# Patient Record
Sex: Female | Born: 1990 | Race: White | Hispanic: No | Marital: Single | Smoking: Never smoker
Health system: Southern US, Community
[De-identification: ages and names within clinical notes are randomized; demographics above are authoritative.]

## PROBLEM LIST (undated history)

## (undated) DIAGNOSIS — B977 Papillomavirus as the cause of diseases classified elsewhere: Secondary | ICD-10-CM

## (undated) DIAGNOSIS — A048 Other specified bacterial intestinal infections: Secondary | ICD-10-CM

## (undated) DIAGNOSIS — B019 Varicella without complication: Secondary | ICD-10-CM

## (undated) DIAGNOSIS — N390 Urinary tract infection, site not specified: Secondary | ICD-10-CM

## (undated) HISTORY — DX: Varicella without complication: B01.9

## (undated) HISTORY — DX: Papillomavirus as the cause of diseases classified elsewhere: B97.7

## (undated) HISTORY — DX: Urinary tract infection, site not specified: N39.0

## (undated) HISTORY — PX: COLPOSCOPY: SHX161

## (undated) HISTORY — DX: Other specified bacterial intestinal infections: A04.8

---
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
## 2012-09-28 LAB — HM PAP SMEAR: HM Pap smear: NORMAL

## 2013-09-26 ENCOUNTER — Telehealth: Payer: Self-pay

## 2013-09-26 NOTE — Telephone Encounter (Signed)
Medication and allergies:  Reviewed and updated  90 day supply/mail order: n/a Local pharmacy: [facility] [id number] - [place], [place] - [address] [facility] [address]    Immunizations due:  Influenza-declined   A/P: Personal, family and past surgical hx updated.   PAP- 01/2013-abnormal; repeat done in July-very low abnormality; repeat scheduled for Feb. 11, 2015. Flu-declined Tdap- Unsure of when she last received the vaccine.   Patient stated that all of her current records were in [place] at [facility].  She plans to complete release form upon appt so records can be faxed here.   To Discuss with Provider: Not at this time.

## 2013-09-28 ENCOUNTER — Ambulatory Visit (INDEPENDENT_AMBULATORY_CARE_PROVIDER_SITE_OTHER): Payer: 59 | Admitting: Family Medicine

## 2013-09-28 ENCOUNTER — Encounter: Payer: Self-pay | Admitting: Family Medicine

## 2013-09-28 VITALS — BP 120/78 | HR 76 | Temp 98.2°F | Resp 16 | Ht 64.25 in | Wt 128.2 lb

## 2013-09-28 DIAGNOSIS — Z0000 Encounter for general adult medical examination without abnormal findings: Secondary | ICD-10-CM

## 2013-09-28 DIAGNOSIS — Z8271 Family history of polycystic kidney: Secondary | ICD-10-CM

## 2013-09-28 LAB — BASIC METABOLIC PANEL
BUN: 9 mg/dL (ref 6–23)
CHLORIDE: 109 meq/L (ref 96–112)
CO2: 24 meq/L (ref 19–32)
Calcium: 8.8 mg/dL (ref 8.4–10.5)
Creatinine, Ser: 0.7 mg/dL (ref 0.4–1.2)
GFR: 107.97 mL/min (ref >60.00)
Glucose, Bld: 82 mg/dL (ref 70–99)
POTASSIUM: 3.8 meq/L (ref 3.5–5.1)
Sodium: 140 mEq/L (ref 135–145)

## 2013-09-28 LAB — LIPID PANEL
CHOL/HDL RATIO: 3
Cholesterol: 171 mg/dL (ref 0–200)
HDL: 62.3 mg/dL (ref >39.00)
LDL CALC: 94 mg/dL (ref 0–99)
TRIGLYCERIDES: 76 mg/dL (ref 0.0–149.0)
VLDL: 15.2 mg/dL (ref 0.0–40.0)

## 2013-09-28 LAB — HEPATIC FUNCTION PANEL
ALBUMIN: 3.5 g/dL (ref 3.5–5.2)
ALT: 11 U/L (ref 0–35)
AST: 14 U/L (ref 0–37)
Alkaline Phosphatase: 39 U/L (ref 39–117)
Bilirubin, Direct: 0 mg/dL (ref 0.0–0.3)
Total Bilirubin: 0.7 mg/dL (ref 0.3–1.2)
Total Protein: 6.6 g/dL (ref 6.0–8.3)

## 2013-09-28 LAB — CBC WITH DIFFERENTIAL/PLATELET
BASOS ABS: 0.1 10*3/uL (ref 0.0–0.1)
BASOS PCT: 1 % (ref 0.0–3.0)
Eosinophils Absolute: 0.2 10*3/uL (ref 0.0–0.7)
Eosinophils Relative: 2.7 % (ref 0.0–5.0)
HCT: 39.9 % (ref 36.0–46.0)
HEMOGLOBIN: 13.1 g/dL (ref 12.0–15.0)
LYMPHS PCT: 29.8 % (ref 12.0–46.0)
Lymphs Abs: 1.7 10*3/uL (ref 0.7–4.0)
MCHC: 32.8 g/dL (ref 30.0–36.0)
MCV: 90.2 fl (ref 78.0–100.0)
Monocytes Absolute: 0.4 10*3/uL (ref 0.1–1.0)
Monocytes Relative: 7.4 % (ref 3.0–12.0)
NEUTROS PCT: 59.1 % (ref 43.0–77.0)
Neutro Abs: 3.3 10*3/uL (ref 1.4–7.7)
Platelets: 296 10*3/uL (ref 150.0–400.0)
RBC: 4.43 Mil/uL (ref 3.87–5.11)
RDW: 12.9 % (ref 11.5–14.6)
WBC: 5.6 10*3/uL (ref 4.5–10.5)

## 2013-09-28 LAB — TSH: TSH: 1.89 u[IU]/mL (ref 0.35–5.50)

## 2013-09-28 NOTE — Assessment & Plan Note (Signed)
Pt has never been evaluated w/ US.  Will get renal US to assess.

## 2013-09-28 NOTE — Progress Notes (Signed)
   Subjective:    Patient ID: Shelly LangtonHannah Labrosse, female    DOB: 03/07/1991, 23 y.o.   MRN: [id number]  HPI New to establish.  Previous MD- Leticia Clasivera [facility]  No concerns   Review of Systems Patient reports no vision/ hearing changes, adenopathy,fever, weight change,  persistant/recurrent hoarseness , swallowing issues, chest pain, palpitations, edema, persistant/recurrent cough, hemoptysis, dyspnea (rest/exertional/paroxysmal nocturnal), gastrointestinal bleeding (melena, rectal bleeding), abdominal pain, significant heartburn, bowel changes, GU symptoms (dysuria, hematuria, incontinence), Gyn symptoms (abnormal  bleeding, pain),  syncope, focal weakness, memory loss, numbness & tingling, skin/hair/nail changes, abnormal bruising or bleeding, anxiety, or depression.     Objective:   Physical Exam General Appearance:    Alert, cooperative, no distress, appears stated age  Head:    Normocephalic, without obvious abnormality, atraumatic  Eyes:    PERRL, conjunctiva/corneas clear, EOM's intact, fundi    benign, both eyes  Ears:    Normal TM's and external ear canals, both ears  Nose:   Nares normal, septum midline, mucosa normal, no drainage    or sinus tenderness  Throat:   Lips, mucosa, and tongue normal; teeth and gums normal  Neck:   Supple, symmetrical, trachea midline, no adenopathy;    Thyroid: no enlargement/tenderness/nodules  Back:     Symmetric, no curvature, ROM normal, no CVA tenderness  Lungs:     Clear to auscultation bilaterally, respirations unlabored  Chest Wall:    No tenderness or deformity   Heart:    Regular rate and rhythm, S1 and S2 normal, no murmur, rub   or gallop  Breast Exam:    Deferred to GYN  Abdomen:     Soft, non-tender, bowel sounds active all four quadrants,    no masses, no organomegaly  Genitalia:    Deferred to GYN  Rectal:    Extremities:   Extremities normal, atraumatic, no cyanosis or edema  Pulses:   2+ and symmetric all extremities    Skin:   Skin color, texture, turgor normal, no rashes or lesions  Lymph nodes:   Cervical, supraclavicular, and axillary nodes normal  Neurologic:   CNII-XII intact, normal strength, sensation and reflexes    throughout          Assessment & Plan:

## 2013-09-28 NOTE — Progress Notes (Signed)
Pre visit review using our clinic review tool, if applicable. No additional management support is needed unless otherwise documented below in the visit note. 

## 2013-09-28 NOTE — Patient Instructions (Signed)
Follow up in 1 year or as needed Keep up the good work! We'll call you with your US appt Call with any questions or concerns Welcome!  We're glad to have you!

## 2013-09-28 NOTE — Assessment & Plan Note (Signed)
Pt's PE WNL.  UTD on GYN.  Check labs.  Anticipatory guidance provided.  

## 2013-09-29 ENCOUNTER — Encounter: Payer: Self-pay | Admitting: General Practice

## 2013-10-02 LAB — VITAMIN D 1,25 DIHYDROXY
VITAMIN D3 1, 25 (OH): 50 pg/mL
Vitamin D 1, 25 (OH)2 Total: 50 pg/mL (ref 18–72)

## 2013-10-03 ENCOUNTER — Encounter: Payer: Self-pay | Admitting: General Practice

## 2013-10-17 ENCOUNTER — Other Ambulatory Visit: Payer: Self-pay | Admitting: Family Medicine

## 2013-10-17 ENCOUNTER — Ambulatory Visit (HOSPITAL_COMMUNITY)
Admission: RE | Admit: 2013-10-17 | Discharge: 2013-10-17 | Disposition: A | Discharge status: Home / Self Care | Payer: 59 | Source: Ambulatory Visit | Attending: Family Medicine | Admitting: Family Medicine

## 2013-10-17 DIAGNOSIS — N390 Urinary tract infection, site not specified: Secondary | ICD-10-CM | POA: Insufficient documentation

## 2013-10-17 DIAGNOSIS — Z8271 Family history of polycystic kidney: Secondary | ICD-10-CM

## 2013-10-17 DIAGNOSIS — N281 Cyst of kidney, acquired: Secondary | ICD-10-CM

## 2013-12-22 ENCOUNTER — Telehealth: Payer: Self-pay | Admitting: Family Medicine

## 2013-12-22 MED ORDER — CIPROFLOXACIN HCL 500 MG PO TABS: 500.0000 mg | ORAL_TABLET | Freq: Two times a day (BID) | ORAL | Status: DC

## 2013-12-22 NOTE — Telephone Encounter (Signed)
Caller name:Shelly Taylor Relation to NU:UVOZDGUpt:patient Call back number:[phone] Pharmacy:[facility] on [address]  Reason for call: . Patient is traveling to [place] on Saturday and is requesting an antibiotic for an upset stomach just in case she gets sick.

## 2013-12-22 NOTE — Telephone Encounter (Signed)
Med filled and pt notified.  

## 2013-12-22 NOTE — Telephone Encounter (Signed)
Ok for Cipro 500mg  BID x10 days in case of Traveller's Diarrhea

## 2014-03-29 LAB — BASIC METABOLIC PANEL
BUN: 12 mg/dL (ref 4–21)
CREATININE: 0.6 mg/dL (ref <=1.1)
Glucose: 81 mg/dL
Sodium: 138 mmol/L (ref 137–147)

## 2014-04-03 ENCOUNTER — Encounter: Payer: Self-pay | Admitting: General Practice

## 2014-06-19 ENCOUNTER — Ambulatory Visit (INDEPENDENT_AMBULATORY_CARE_PROVIDER_SITE_OTHER): Payer: 59 | Admitting: Family Medicine

## 2014-06-19 ENCOUNTER — Encounter: Payer: Self-pay | Admitting: Family Medicine

## 2014-06-19 VITALS — BP 124/84 | HR 71 | Temp 98.1°F | Resp 16 | Wt 136.5 lb

## 2014-06-19 DIAGNOSIS — B070 Plantar wart: Secondary | ICD-10-CM

## 2014-06-19 NOTE — Patient Instructions (Addendum)
Schedule your complete physical at your convenience (after Sep 28, 2014) Apply Neosporin to the surrounding tissue- some redness and sensitivity is to be expected Apply Compound W to the wart and apply duct tape every 3-4 days Call with any questions or concerns Happy Holidays!!!

## 2014-06-19 NOTE — Progress Notes (Signed)
   Subjective:    Patient ID: Shelly LangtonHannah Taylor, female    DOB: 09/22/1990, 23 y.o.   MRN: [id number]  HPI wart- plantar wart at base of R 5th toe.  Now painful to touch or 'step on it wrong'.  Pt has not used OTC wart medication   Review of Systems For ROS see HPI     Objective:   Physical Exam  Constitutional: She appears well-developed and well-nourished. No distress.  Skin: Skin is warm and dry.  1 cm plantar wart on R foot at base of 5th toe.  Pt tolerated 4 cycles of freeze/thaw w/ liquid nitrogen gun  Vitals reviewed.         Assessment & Plan:

## 2014-06-19 NOTE — Assessment & Plan Note (Signed)
New.  Pt tolerated 4 cycles of freeze/thaw w/ liquid nitrogen.  Reviewed possible need to repeat procedure and to use OTC Compound W at home to finish treatment.  Pt expressed understanding and is in agreement w/ plan.

## 2014-06-19 NOTE — Progress Notes (Signed)
Pre visit review using our clinic review tool, if applicable. No additional management support is needed unless otherwise documented below in the visit note. 

## 2014-07-26 ENCOUNTER — Ambulatory Visit: Payer: Self-pay | Admitting: Podiatry

## 2014-07-31 ENCOUNTER — Ambulatory Visit: Payer: Self-pay | Admitting: Podiatry

## 2014-08-01 IMAGING — US US RENAL
1 series · 14 of 25 positions shown · non-contrast
Comparison: None.

CLINICAL DATA: Family history of polycystic kidney disease.  UTIs.

EXAM:
RENAL/URINARY TRACT ULTRASOUND COMPLETE

[Series 1: us renal · 0.21mm/px · 14 of 67 slices shown]
[im 1/67]
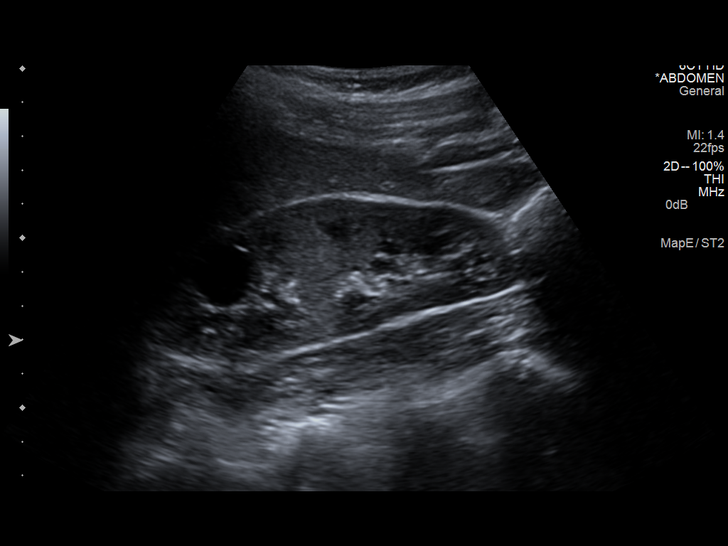
[im 6/67]
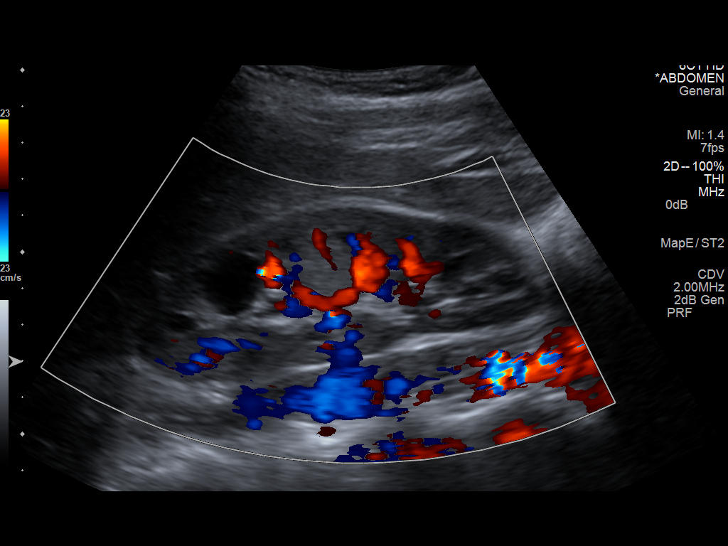
[im 12/67]
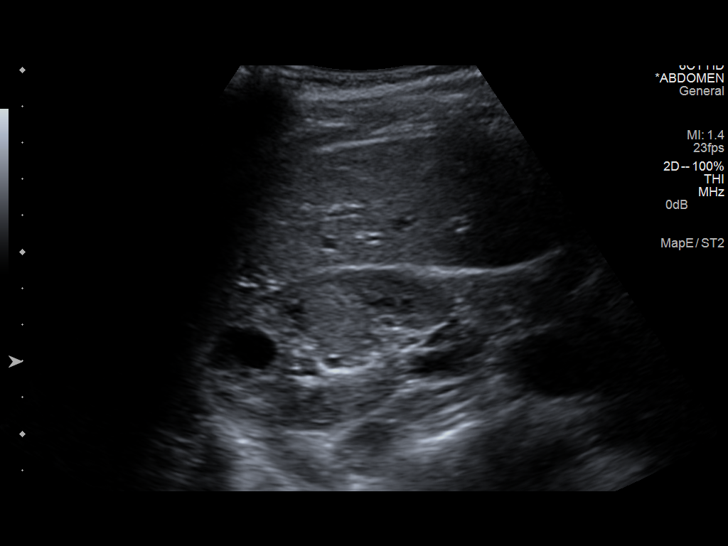
[im 17/67]
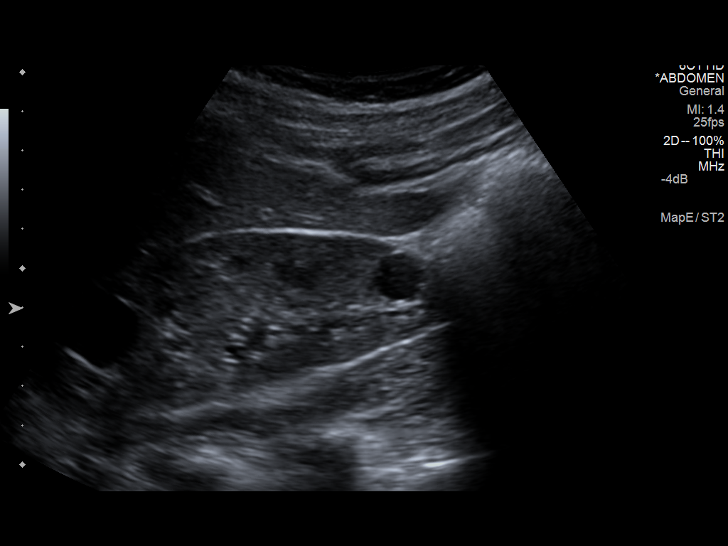
[im 23/67]
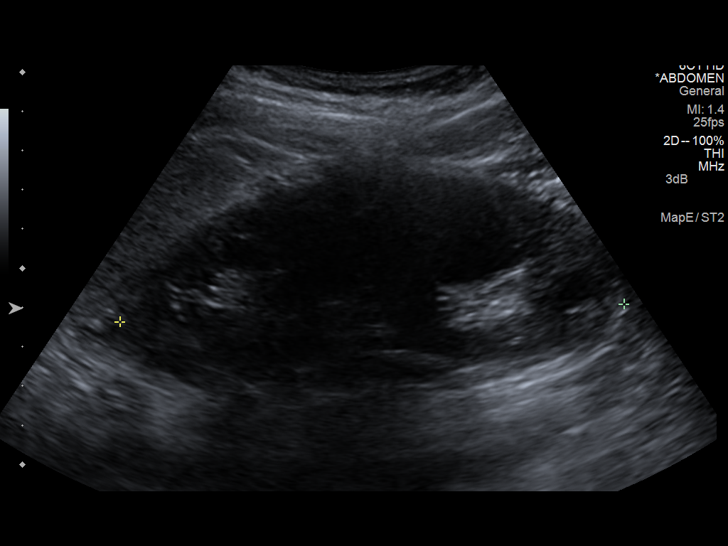
[im 25/67]
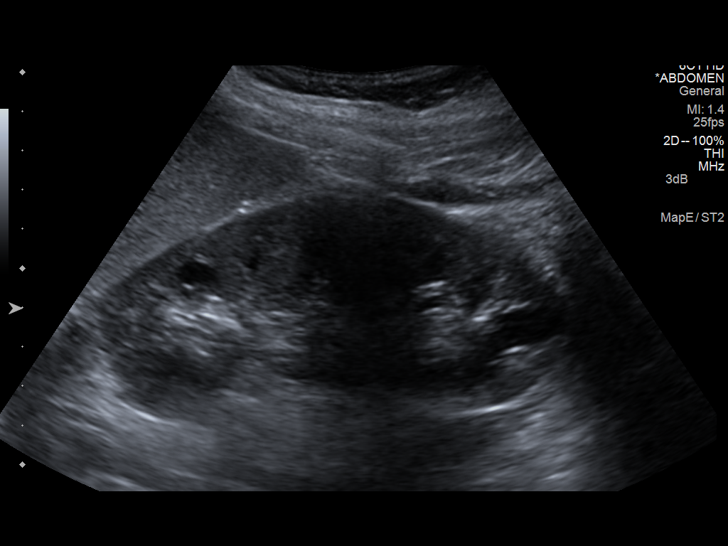
[im 31/67]
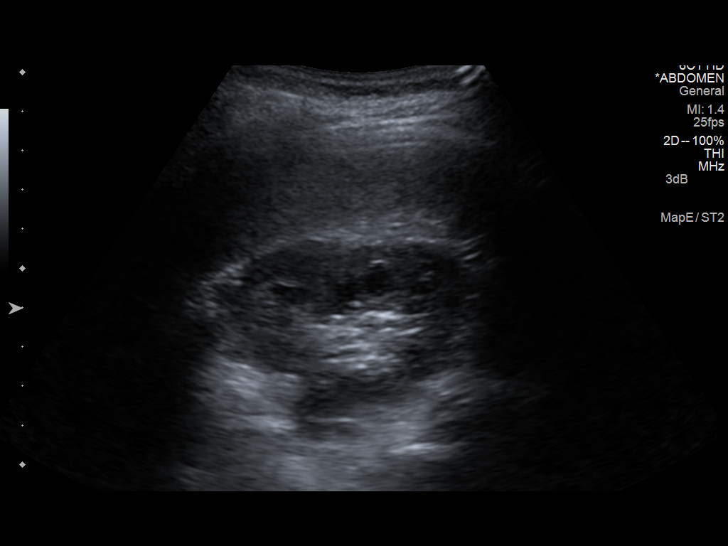
[im 36/67]
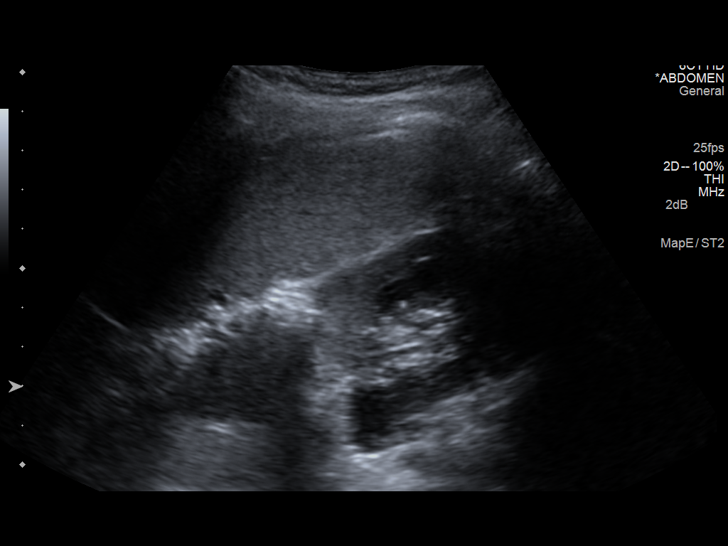
[im 42/67]
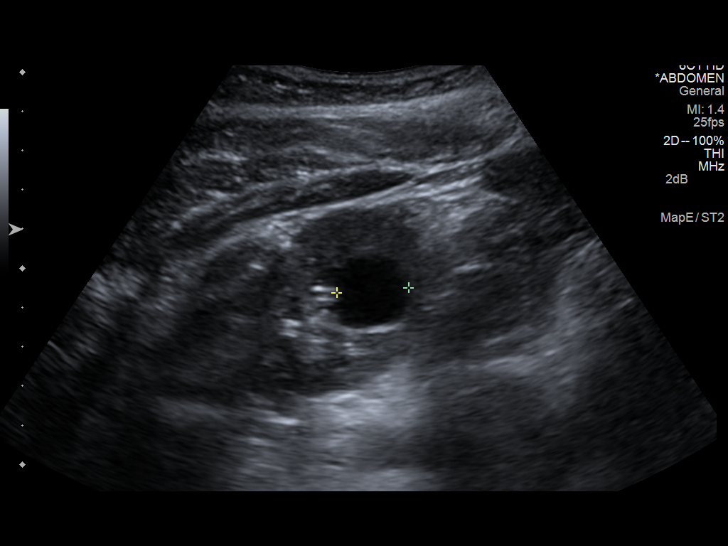
[im 45/67]
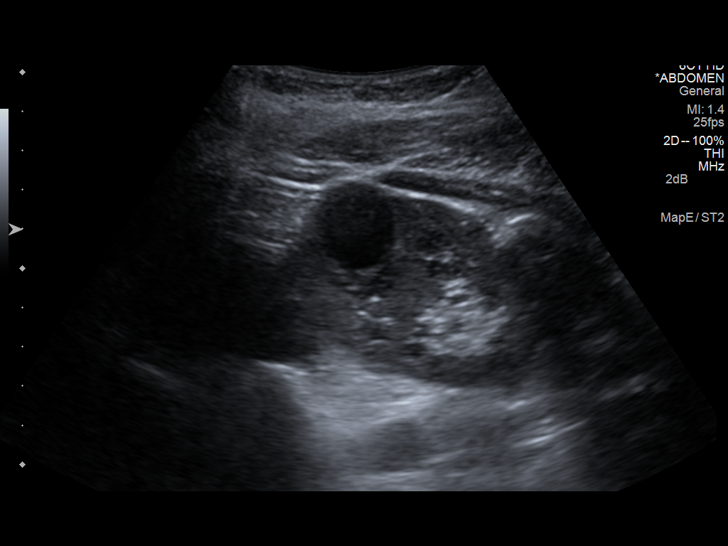
[im 50/67]
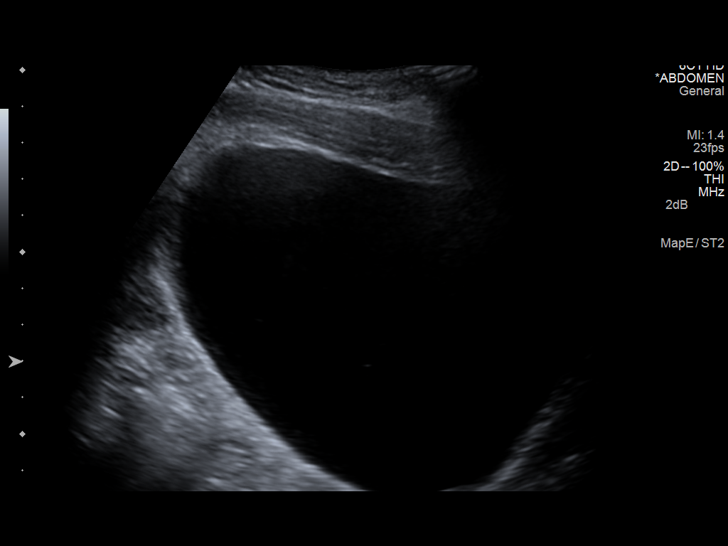
[im 56/67]
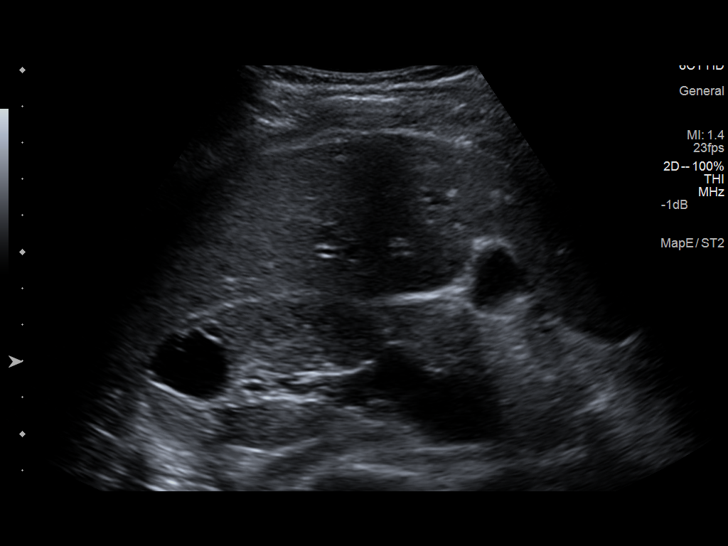
[im 61/67]
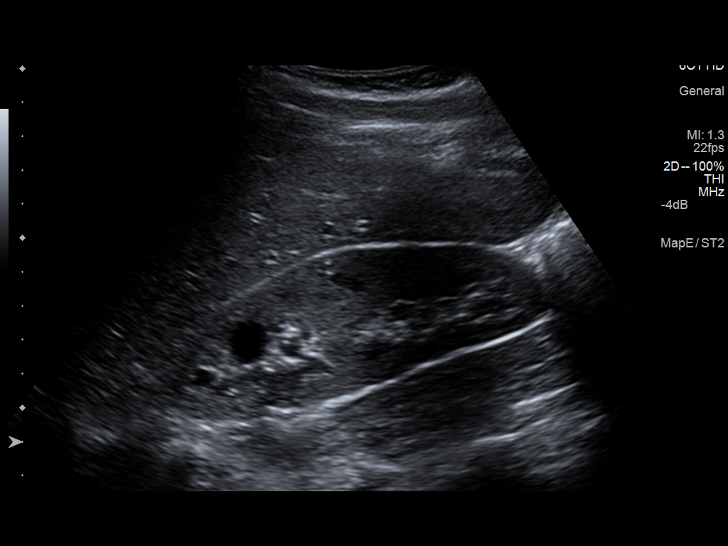
[im 67/67]
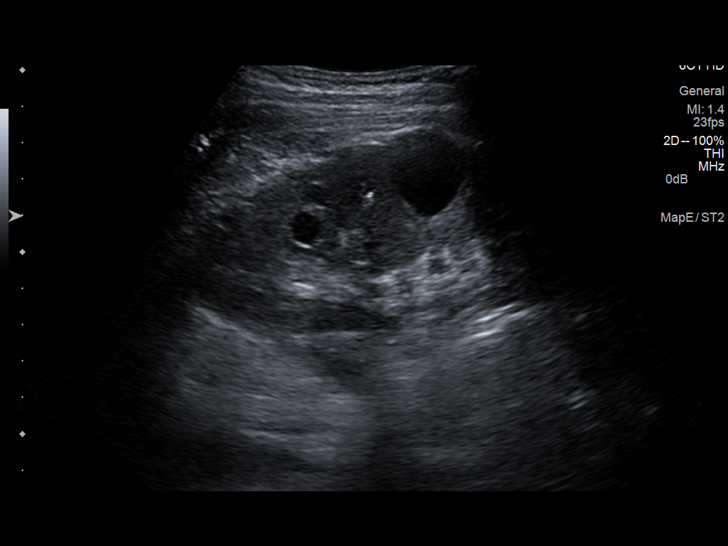

[14 of 25 positions shown; findings below may reference images not displayed]

FINDINGS: Right Kidney:

Length: 11.6 cm. There are 2 anechoic right renal masses. The
anechoic mass in the upper pole measures 1.9 x 1.9 x 2.3 cm. The
anechoic mass in the inferior pole measures 1.3 x 1.2 x 1.3 cm.
Echogenicity within normal limits. No hydronephrosis visualized.

Left Kidney:

Length: 12.8 cm. There are 3 anechoic left renal masses. The upper
pole anechoic renal mass measures 1.8 x 1.9 x 1.1 cm. The anechoic
left interpolar renal mass measures 1.8 x 2.1 x 2.3 cm with an
adjacent 2.1 x 2.1 x 1.8 cm anechoic mass.. Echogenicity within
normal limits. No hydronephrosis visualized.

Bladder:

Appears normal for degree of bladder distention.
IMPRESSION: 1. Multiple bilateral renal cysts.
2. No obstructive uropathy.

## 2014-08-16 ENCOUNTER — Other Ambulatory Visit: Payer: Self-pay | Admitting: Physician Assistant

## 2014-08-16 ENCOUNTER — Ambulatory Visit: Payer: 59 | Admitting: Medical

## 2014-08-16 DIAGNOSIS — R1013 Epigastric pain: Secondary | ICD-10-CM

## 2014-08-16 DIAGNOSIS — R1011 Right upper quadrant pain: Secondary | ICD-10-CM

## 2014-08-16 DIAGNOSIS — R110 Nausea: Secondary | ICD-10-CM

## 2014-08-25 ENCOUNTER — Other Ambulatory Visit: Payer: 59

## 2014-08-28 ENCOUNTER — Other Ambulatory Visit: Payer: 59

## 2014-08-28 ENCOUNTER — Ambulatory Visit: Payer: 59 | Admitting: Family Medicine

## 2014-10-11 ENCOUNTER — Ambulatory Visit (INDEPENDENT_AMBULATORY_CARE_PROVIDER_SITE_OTHER): Payer: 59 | Admitting: Family Medicine

## 2014-10-11 ENCOUNTER — Encounter: Payer: Self-pay | Admitting: Family Medicine

## 2014-10-11 VITALS — BP 106/78 | HR 68 | Temp 97.9°F | Resp 16 | Ht 63.5 in | Wt 137.4 lb

## 2014-10-11 DIAGNOSIS — H538 Other visual disturbances: Secondary | ICD-10-CM

## 2014-10-11 DIAGNOSIS — R5383 Other fatigue: Secondary | ICD-10-CM

## 2014-10-11 DIAGNOSIS — Z0000 Encounter for general adult medical examination without abnormal findings: Secondary | ICD-10-CM

## 2014-10-11 NOTE — Progress Notes (Signed)
Pre visit review using our clinic review tool, if applicable. No additional management support is needed unless otherwise documented below in the visit note. 

## 2014-10-11 NOTE — Patient Instructions (Signed)
Follow up as need if fatigue doesn't improve We'll notify you of your lab results and make any changes if needed We'll call you with your eye exam appt Continue to make healthy food choices and get regular exercise- you look great! Call with any questions or concerns Happy Spring!

## 2014-10-11 NOTE — Progress Notes (Signed)
   Subjective:    Patient ID: Vinnie LangtonHannah Pajak, female    DOB: 11/13/1990, 24 y.o.   MRN: [id number]  HPI CPE- UTD on GYN Su [facility]).  Blurry vision- pt reports since HS she develops blurring around the edges of her vision, like tunnel vision.  Recently has had double vision on 2-3 occasions- this resolved w/ closing 1 eye.  Pt doesn't know if she ever had an eye exam.  Fatigue- pt reports she attempts to get at least 8 hrs of sleep nightly but still finding herself excessively tired.  Particularly difficult to drive more than 30 minutes b/c she will start to fall asleep.  Time of day doesn't matter.  Will wake feeling rested.     Review of Systems Patient reports no hearing changes, adenopathy,fever, weight change,  persistant/recurrent hoarseness , swallowing issues, chest pain, palpitations, edema, persistant/recurrent cough, hemoptysis, dyspnea (rest/exertional/paroxysmal nocturnal), gastrointestinal bleeding (melena, rectal bleeding), significant heartburn, bowel changes, GU symptoms (dysuria, hematuria, incontinence), Gyn symptoms (abnormal  bleeding, pain),  syncope, focal weakness, memory loss, numbness & tingling, skin/hair/nail changes, abnormal bruising or bleeding, anxiety, or depression.   + H pylori infxn ([facility])    Objective:   Physical Exam General Appearance:    Alert, cooperative, no distress, appears stated age  Head:    Normocephalic, without obvious abnormality, atraumatic  Eyes:    PERRL, conjunctiva/corneas clear, EOM's intact, fundi    benign, both eyes  Ears:    Normal TM's and external ear canals, both ears  Nose:   Nares normal, septum midline, mucosa normal, no drainage    or sinus tenderness  Throat:   Lips, mucosa, and tongue normal; teeth and gums normal  Neck:   Supple, symmetrical, trachea midline, no adenopathy;    Thyroid: no enlargement/tenderness/nodules  Back:     Symmetric, no curvature, ROM normal, no CVA tenderness  Lungs:     Clear to  auscultation bilaterally, respirations unlabored  Chest Wall:    No tenderness or deformity   Heart:    Regular rate and rhythm, S1 and S2 normal, no murmur, rub   or gallop  Breast Exam:    Deferred to GYN  Abdomen:     Soft, non-tender, bowel sounds active all four quadrants,    no masses, no organomegaly  Genitalia:    Deferred to GYN  Rectal:    Extremities:   Extremities normal, atraumatic, no cyanosis or edema  Pulses:   2+ and symmetric all extremities  Skin:   Skin color, texture, turgor normal, no rashes or lesions  Lymph nodes:   Cervical, supraclavicular, and axillary nodes normal  Neurologic:   CNII-XII intact, normal strength, sensation and reflexes    throughout          Assessment & Plan:

## 2014-10-12 LAB — CBC WITH DIFFERENTIAL/PLATELET
BASOS ABS: 0 10*3/uL (ref 0.0–0.1)
Basophils Relative: 0.2 % (ref 0.0–3.0)
EOS PCT: 8.1 % — AB (ref 0.0–5.0)
Eosinophils Absolute: 0.8 10*3/uL — ABNORMAL HIGH (ref 0.0–0.7)
HEMATOCRIT: 39.2 % (ref 36.0–46.0)
Hemoglobin: 13.4 g/dL (ref 12.0–15.0)
LYMPHS ABS: 1.9 10*3/uL (ref 0.7–4.0)
Lymphocytes Relative: 20.8 % (ref 12.0–46.0)
MCHC: 34.3 g/dL (ref 30.0–36.0)
MCV: 85.6 fl (ref 78.0–100.0)
Monocytes Absolute: 0.4 10*3/uL (ref 0.1–1.0)
Monocytes Relative: 4 % (ref 3.0–12.0)
NEUTROS ABS: 6.2 10*3/uL (ref 1.4–7.7)
Neutrophils Relative %: 66.9 % (ref 43.0–77.0)
Platelets: 329 10*3/uL (ref 150.0–400.0)
RBC: 4.58 Mil/uL (ref 3.87–5.11)
RDW: 12.7 % (ref 11.5–15.5)
WBC: 9.3 10*3/uL (ref 4.0–10.5)

## 2014-10-12 LAB — BASIC METABOLIC PANEL
BUN: 11 mg/dL (ref 6–23)
CO2: 25 mEq/L (ref 19–32)
CREATININE: 0.71 mg/dL (ref 0.40–1.20)
Calcium: 9.7 mg/dL (ref 8.4–10.5)
Chloride: 104 mEq/L (ref 96–112)
GFR: 108.69 mL/min (ref >60.00)
Glucose, Bld: 74 mg/dL (ref 70–99)
Potassium: 4.3 mEq/L (ref 3.5–5.1)
SODIUM: 136 meq/L (ref 135–145)

## 2014-10-12 LAB — HEPATIC FUNCTION PANEL
ALT: 9 U/L (ref 0–35)
AST: 14 U/L (ref 0–37)
Albumin: 4.3 g/dL (ref 3.5–5.2)
Alkaline Phosphatase: 56 U/L (ref 39–117)
BILIRUBIN TOTAL: 0.3 mg/dL (ref 0.2–1.2)
Bilirubin, Direct: 0 mg/dL (ref 0.0–0.3)
TOTAL PROTEIN: 7.2 g/dL (ref 6.0–8.3)

## 2014-10-12 LAB — LIPID PANEL
Cholesterol: 185 mg/dL (ref 0–200)
HDL: 61.6 mg/dL (ref >39.00)
LDL Cholesterol: 94 mg/dL (ref 0–99)
NonHDL: 123.4
Total CHOL/HDL Ratio: 3
Triglycerides: 147 mg/dL (ref 0.0–149.0)
VLDL: 29.4 mg/dL (ref 0.0–40.0)

## 2014-10-12 LAB — TSH: TSH: 1.53 u[IU]/mL (ref 0.35–4.50)

## 2014-10-12 LAB — VITAMIN D 25 HYDROXY (VIT D DEFICIENCY, FRACTURES): VITD: 39.13 ng/mL (ref 30.00–100.00)

## 2014-10-15 NOTE — Assessment & Plan Note (Signed)
New.  Check labs to r/o metabolic cause.  Discussed that the repetitive motion of driving is sedating for many people and discussed ways to increase her alertness.  Will follow.

## 2014-10-15 NOTE — Assessment & Plan Note (Signed)
Pt's PE WNL.  UTD on GYN.  Check labs.  Anticipatory guidance provided.  

## 2014-10-15 NOTE — Assessment & Plan Note (Signed)
New.  Refer to ophtho for complete eye exam.  Pt expressed understanding and is in agreement w/ plan.

## 2015-04-18 ENCOUNTER — Ambulatory Visit (INDEPENDENT_AMBULATORY_CARE_PROVIDER_SITE_OTHER): Payer: Self-pay | Admitting: Family Medicine

## 2015-04-18 VITALS — BP 130/82 | HR 108 | Temp 98.3°F | Resp 16 | Ht 63.0 in | Wt 141.4 lb

## 2015-04-18 DIAGNOSIS — L739 Follicular disorder, unspecified: Secondary | ICD-10-CM

## 2015-04-18 DIAGNOSIS — Z8742 Personal history of other diseases of the female genital tract: Secondary | ICD-10-CM

## 2015-04-18 DIAGNOSIS — N898 Other specified noninflammatory disorders of vagina: Secondary | ICD-10-CM

## 2015-04-18 LAB — POCT WET PREP WITH KOH
CLUE CELLS WET PREP PER HPF POC: NEGATIVE
KOH Prep POC: NEGATIVE
RBC Wet Prep HPF POC: NEGATIVE
Trichomonas, UA: NEGATIVE
Yeast Wet Prep HPF POC: NEGATIVE

## 2015-04-18 MED ORDER — MUPIROCIN 2 % EX OINT: 1.0000 "application " | TOPICAL_OINTMENT | Freq: Four times a day (QID) | CUTANEOUS | Status: DC

## 2015-04-18 NOTE — Patient Instructions (Signed)
Folliculitis  Folliculitis is inflammation of the hair follicle gland in the skin, out of which hair grows. It is often caused by a bacterial infection. Folliculitis may occur on any skin surface of the body, but the bacteria that cause folliculitis survive best in warm, moist environments. For this reason, folliculitis is most common on the back.  SYMPTOMS   · Red bumps on skin (papules).  · White bumps on skin (pustules).  · Itchy rash.  · Painful rash.  · Fever.  · Swollen lymph glands.  CAUSES   · Poor hygiene.  · Excessive or prolonged sweating.  · Irritation from clothing or athletic gear.  · Tendency toward a type of bacteria (Staphylococcus) infections.  RISK INCREASES WITH:  · Not using antibacterial soap.  · Remaining in sweaty or damp clothing.  · Poor immunity.  PREVENTION   · Remove sweaty clothing as soon as possible after activity.  · Bathe or shower as soon as possible after a workout.  · Use antibacterial soap.  · Pay special attention to your back when cleansing.  TREATMENT   Bacterial folliculitis responds well to oral antibiotics and antibiotics applied to the skin. Treatment first involves cleansing the affected area with antibacterial soap. Warm compression coverings to the area can help large follicles to drain. After washing, applying dressings that contain drying agents will help diminish bacteria in follicles, and help the rash go away more quickly. If the condition does not seem to go away with changes in hygiene alone, antibiotic creams and ointments can be applied. For more serious cases, when the infection extends into the soft tissues (cellulitis), oral antibiotics can be given. Finally, on rare occasions, incision and drainage of the follicles may be needed for the infection to be cured.   Document Released: 08/04/2005 Document Revised: 10/27/2011 Document Reviewed: 11/16/2008  ExitCare® Patient Information ©2015 [employer]. This information is not intended to replace advice given  to you by your health care provider. Make sure you discuss any questions you have with your health care provider.

## 2015-04-18 NOTE — Progress Notes (Signed)
Subjective:    Patient ID: Shelly Taylor, female    DOB: 1991-07-17, 24 y.o.   MRN: [id number] Chief Complaint  Patient presents with  . Vaginal Discharge  . std check    HPI   Has a small pimple on her left buttock cheek near perineum - very that was a little itchy and then scratched it and has been a little painful or red before.  Not enlarging or actively draining.  Has had similar things prior like hair follicle infection that always resolved without difficulty but this is much more proximal to her vaginal area and so much more bothersome as well as difficult getting a view of so she is not sure what it is.  Has a cloudy white discharge for several days.  No vaginal products/creams/discharge. Slight odor but no vaginal itching.  Figures as long as she is here she might as well have it checked - normally probably wouldn't bother her.  Has a h/o UTIs but no urine sxs currently.  Ocella religiously and condoms usually.  She is in a monogamous relationship and has only had 2 sexual partner in lifetime.  Does have a h/o mildly abnormal pap - sounds like it is ASCUS with +HPV in [place] 01/2013 initially, since has been followed at [facility] [place] [facility] with repeat every 6 mos and sev colposcopies w/ biopsies - have now been normal and is going to be able to switch back in annual paps. Would like to transfer her well woman care to a provider who is in the [facility] system so will discuss w/ her PCP.  She has not received HPV vaccination series which her gyn did recommend her to do but pt wants second opinion from new gyn prior.  PCP: [facility]  Past Medical History  Diagnosis Date  . Chicken pox   . UTI (lower urinary tract infection)     3-4x in 2014  . HPV in female   . H. pylori infection    Current Outpatient Prescriptions on File Prior to Visit  Medication Sig Dispense Refill  . drospirenone-ethinyl estradiol (YASMIN,[name],SYEDA) 3-0.03 MG tablet Take 1 tablet by mouth  daily.     No current facility-administered medications on file prior to visit.   No Known Allergies   Review of Systems  Constitutional: Negative for fever, chills, diaphoresis, activity change, appetite change, fatigue and unexpected weight change.  Gastrointestinal: Negative for abdominal pain, diarrhea, constipation, blood in stool, anal bleeding and rectal pain.  Genitourinary: Positive for vaginal discharge and genital sores. Negative for dysuria, urgency, frequency, hematuria, decreased urine volume, vaginal bleeding, difficulty urinating, vaginal pain, menstrual problem, pelvic pain and dyspareunia.  Musculoskeletal: Negative for gait problem.  Skin: Positive for color change and wound.  Hematological: Negative for adenopathy.  Psychiatric/Behavioral: Negative for sleep disturbance and dysphoric mood. The patient is not nervous/anxious.        Objective:  BP 130/82 mmHg  Pulse 108  Temp(Src) 98.3 F (36.8 C) (Oral)  Resp 16  Ht  (1.6 m)  Wt 141 lb 6.4 oz (64.139 kg)  BMI 25.05 kg/m2  SpO2 99%  LMP 04/03/2015  Physical Exam  Constitutional: She is oriented to person, place, and time. She appears well-developed and well-nourished. No distress.  HENT:  Head: Normocephalic and atraumatic.  Right Ear: External ear normal.  Eyes: Conjunctivae are normal. No scleral icterus.  Pulmonary/Chest: Effort normal.  Genitourinary: Vagina normal and uterus normal.    There is no rash, tenderness, lesion  or injury on the right labia. There is no rash, tenderness, lesion or injury on the left labia. Cervix exhibits no motion tenderness, no discharge and no friability. Right adnexum displays no mass, no tenderness and no fullness. Left adnexum displays no mass, no tenderness and no fullness.  Lymphadenopathy:       Right: No inguinal adenopathy present.       Left: No inguinal adenopathy present.  Neurological: She is alert and oriented to person, place, and time.  Skin: Skin is  warm and dry. Lesion noted. She is not diaphoretic.  1 cm superficial erythematous nodule with small amount of induration but no fluctuance. Central pinpoint pustule unroofed with tb needle and pressure applied without drainage and minimal pain.   Psychiatric: She has a normal mood and affect. Her behavior is normal.      Results for orders placed or performed in visit on 04/18/15  POCT Wet Prep with KOH  Result Value Ref Range   Trichomonas, UA Negative    Clue Cells Wet Prep HPF POC neg    Epithelial Wet Prep HPF POC Many Few, Moderate, Many   Yeast Wet Prep HPF POC neg    Bacteria Wet Prep HPF POC Few None, Few   RBC Wet Prep HPF POC neg    WBC Wet Prep HPF POC 10-12    KOH Prep POC Negative        Assessment & Plan:   1. Vaginal discharge - UA and exam nml, pt reassured likely physiologic  2. Folliculitis - carbuncle type lesion - no concern for abscess/cellulitis or Bartholin's.  Very superficial so rec warm wet compresses and sits baths q2-4hrs followed by top bactroban until resolved, RTC if sxs worsen  3. History of abnormal cervical Pap smear - pt will discuss w/ PCP, rec pt obtain HPV series.    Orders Placed This Encounter  Procedures  . GC/Chlamydia Probe Amp  . POCT Skin KOH  . POCT Wet Prep with KOH    Meds ordered this encounter  Medications  . mupirocin ointment (BACTROBAN) 2 %    Sig: Apply 1 application topically 4 (four) times daily.    Dispense:  30 g    Refill:  1     Norberto Sorenson, MD MPH

## 2015-04-20 ENCOUNTER — Ambulatory Visit: Payer: Self-pay | Admitting: Family Medicine

## 2015-04-20 LAB — GC/CHLAMYDIA PROBE AMP
CT Probe RNA: NEGATIVE
GC Probe RNA: NEGATIVE

## 2015-04-24 ENCOUNTER — Telehealth: Payer: Self-pay | Admitting: General Practice

## 2015-04-24 NOTE — Telephone Encounter (Signed)
-----   Message from Sheliah Hatch, MD sent at 04/19/2015  8:00 AM EDT ----- Please see if pt wants referral to [facility] (they are the only group I know of in [facility]).  We can certainly start her HPV series here if she is interested or she can wait until she sees GYN.  Thanks! KT ----- Message -----    From: Sherren Mocha, MD    Sent: 04/18/2015   9:12 PM      To: Sheliah Hatch, MD  I had the pleasure of seeing your delightful pt for a mild carbuncle in an inconvenient spot.  However, she discussed that she has been followed by [facility] [place] [facility] for abnormal pap smears and colposcopies and that she would like to transfer her gyn care elsewhere but wanted to go within the [facility] system and definitely wanted your opinion on where to go.  I recommended [facility] w/ [facility] since she has no plans for preg in the near future but i'm sure she would appreciate a call from your office.  She wants to discuss receiving the HPV series w/ her new gyn. Carley Hammed

## 2015-04-24 NOTE — Telephone Encounter (Signed)
Called pt in regards to the Staff message received. LMOVM to return call to verify if pt would like a referral to [facility] and if she would prefer the HPV vaccinations there or to begin at our office.

## 2015-04-27 NOTE — Telephone Encounter (Signed)
Called pt and lmovm again. Closing encountner until pt returns call.

## 2015-05-03 ENCOUNTER — Telehealth: Payer: Self-pay | Admitting: Family Medicine

## 2015-05-03 NOTE — Telephone Encounter (Signed)
Pt was no show 04/20/15 9:15am, acute for std check, pt has not rescheduled, charge for no show?

## 2015-05-03 NOTE — Telephone Encounter (Signed)
yes

## 2015-07-19 ENCOUNTER — Telehealth: Payer: Self-pay | Admitting: Family Medicine

## 2015-07-19 MED ORDER — DROSPIRENONE-ETHINYL ESTRADIOL 3-0.03 MG PO TABS: 1.0000 | ORAL_TABLET | Freq: Every day | ORAL | Status: DC

## 2015-07-19 NOTE — Telephone Encounter (Signed)
Caller name: Self   Can be reached: [phone]  Pharmacy:  [id number] - [place], [place] - [address] AT [facility] [address] [phone] (Phone) [phone] (Fax)         Reason for call: Request refill on drospirenone-ethinyl estradiol (YASMIN,[name],SYEDA) 3-0.03 MG tablet [[id number]][[id number]]

## 2015-07-19 NOTE — Telephone Encounter (Signed)
Medication filled to pharmacy as requested.   

## 2015-09-10 ENCOUNTER — Emergency Department (HOSPITAL_BASED_OUTPATIENT_CLINIC_OR_DEPARTMENT_OTHER)
Admission: EM | Admit: 2015-09-10 | Discharge: 2015-09-10 | Disposition: A | Discharge status: Home / Self Care | Payer: Commercial Managed Care - HMO | Attending: Emergency Medicine | Admitting: Emergency Medicine

## 2015-09-10 ENCOUNTER — Encounter (HOSPITAL_BASED_OUTPATIENT_CLINIC_OR_DEPARTMENT_OTHER): Payer: Self-pay | Admitting: *Deleted

## 2015-09-10 DIAGNOSIS — Z792 Long term (current) use of antibiotics: Secondary | ICD-10-CM | POA: Insufficient documentation

## 2015-09-10 DIAGNOSIS — Z3202 Encounter for pregnancy test, result negative: Secondary | ICD-10-CM | POA: Insufficient documentation

## 2015-09-10 DIAGNOSIS — Z8619 Personal history of other infectious and parasitic diseases: Secondary | ICD-10-CM | POA: Diagnosis not present

## 2015-09-10 DIAGNOSIS — N390 Urinary tract infection, site not specified: Secondary | ICD-10-CM | POA: Insufficient documentation

## 2015-09-10 DIAGNOSIS — R300 Dysuria: Secondary | ICD-10-CM | POA: Diagnosis present

## 2015-09-10 DIAGNOSIS — Z79899 Other long term (current) drug therapy: Secondary | ICD-10-CM | POA: Insufficient documentation

## 2015-09-10 LAB — URINALYSIS, ROUTINE W REFLEX MICROSCOPIC
BILIRUBIN URINE: NEGATIVE
GLUCOSE, UA: NEGATIVE mg/dL
KETONES UR: NEGATIVE mg/dL
NITRITE: NEGATIVE
PH: 6 (ref 5.0–8.0)
Protein, ur: 30 mg/dL — AB
SPECIFIC GRAVITY, URINE: 1.019 (ref 1.005–1.030)

## 2015-09-10 LAB — PREGNANCY, URINE: Preg Test, Ur: NEGATIVE

## 2015-09-10 LAB — URINE MICROSCOPIC-ADD ON

## 2015-09-10 MED ORDER — PHENAZOPYRIDINE HCL 100 MG PO TABS
95.0000 mg | ORAL_TABLET | Freq: Once | ORAL | Status: AC
  Administered 2015-09-10: 100 mg via ORAL
  Filled 2015-09-10: qty 1

## 2015-09-10 MED ORDER — PHENAZOPYRIDINE HCL 200 MG PO TABS: 200.0000 mg | ORAL_TABLET | Freq: Three times a day (TID) | ORAL | Status: DC

## 2015-09-10 MED ORDER — CEFPODOXIME PROXETIL 100 MG PO TABS: 100.0000 mg | ORAL_TABLET | Freq: Two times a day (BID) | ORAL | Status: DC

## 2015-09-10 MED ORDER — FOSFOMYCIN TROMETHAMINE 3 G PO PACK
3.0000 g | PACK | Freq: Once | ORAL | Status: AC
  Administered 2015-09-10: 3 g via ORAL
  Filled 2015-09-10: qty 3

## 2015-09-10 NOTE — ED Provider Notes (Signed)
CSN: [id number]     Arrival date & time 09/10/15  4098 History   First MD Initiated Contact with Patient 09/10/15 [phone]     Chief Complaint  Patient presents with  . Dysuria     (Consider location/radiation/quality/duration/timing/severity/associated sxs/prior Treatment) Patient is a 25 y.o. female presenting with dysuria. The history is provided by the patient.  Dysuria Pain quality:  Sharp and burning Pain severity:  Moderate Onset quality:  Sudden Duration:  2 hours Timing:  Constant Progression:  Unchanged Chronicity:  Recurrent Recent urinary tract infections: no   Relieved by:  Nothing Worsened by:  Nothing tried Ineffective treatments:  None tried Urinary symptoms: frequent urination and hesitancy   Associated symptoms: no abdominal pain, no fever, no flank pain, no nausea, no vaginal discharge and no vomiting   Risk factors: recurrent urinary tract infections   Risk factors: no hx of pyelonephritis and not pregnant     25 yo F with a chief complaint of dysuria. The started just couple hours ago. Patient denies fevers or flank pain. Has a history of recurrent urinary tract infections in the past. Patient denies any vaginal discharge denies abdominal pain.  Past Medical History  Diagnosis Date  . Chicken pox   . UTI (lower urinary tract infection)     3-4x in 2014  . HPV in female   . H. pylori infection    Past Surgical History  Procedure Laterality Date  . Wisdom tooth extraction  Dec 2011    all four   Family History  Problem Relation Age of Onset  . Ovarian cancer Maternal Grandmother   . Uterine cancer Maternal Grandmother   . Polycystic kidney disease Mother     and maternal grandmother  . Colon cancer      maternal great grandfather  . Hyperlipidemia Maternal Grandfather   . Heart disease Maternal Grandfather   . Stroke Maternal Grandfather   . Diabetes Maternal Grandfather    Social History  Substance Use Topics  . Smoking status: Never Smoker    . Smokeless tobacco: Never Used  . Alcohol Use: No     Comment: social   OB History    No data available     Review of Systems  Constitutional: Negative for fever and chills.  HENT: Negative for congestion and rhinorrhea.   Eyes: Negative for redness and visual disturbance.  Respiratory: Negative for shortness of breath and wheezing.   Cardiovascular: Negative for chest pain and palpitations.  Gastrointestinal: Negative for nausea, vomiting and abdominal pain.  Genitourinary: Positive for dysuria. Negative for urgency, flank pain and vaginal discharge.  Musculoskeletal: Negative for myalgias and arthralgias.  Skin: Negative for pallor and wound.  Neurological: Negative for dizziness and headaches.      Allergies  Review of patient's allergies indicates no known allergies.  Home Medications   Prior to Admission medications   Medication Sig Start Date End Date Taking? Authorizing Provider  cefpodoxime (VANTIN) 100 MG tablet Take 1 tablet (100 mg total) by mouth 2 (two) times daily. 09/10/15   Melene Plan, DO  drospirenone-ethinyl estradiol (YASMIN,[name],SYEDA) 3-0.03 MG tablet Take 1 tablet by mouth daily. 07/19/15   Sheliah Hatch, MD  mupirocin ointment (BACTROBAN) 2 % Apply 1 application topically 4 (four) times daily. 04/18/15   Sherren Mocha, MD  phenazopyridine (PYRIDIUM) 200 MG tablet Take 1 tablet (200 mg total) by mouth 3 (three) times daily. 09/10/15   Melene Plan, DO   BP 118/81 mmHg  Pulse 77  Temp(Src) 98.6 F (37 C) (Oral)  Resp 18  Ht  (1.575 m)  Wt 135 lb (61.236 kg)  BMI 24.69 kg/m2  SpO2 99%  LMP 08/26/2015 (Exact Date) Physical Exam  Constitutional: She is oriented to person, place, and time. She appears well-developed and well-nourished. No distress.  HENT:  Head: Normocephalic and atraumatic.  Eyes: EOM are normal. Pupils are equal, round, and reactive to light.  Neck: Normal range of motion. Neck supple.  Cardiovascular: Normal rate and regular  rhythm.  Exam reveals no gallop and no friction rub.   No murmur heard. Pulmonary/Chest: Effort normal. She has no wheezes. She has no rales.  Abdominal: Soft. She exhibits no distension. There is no tenderness.  Musculoskeletal: She exhibits no edema or tenderness.  Neurological: She is alert and oriented to person, place, and time.  Skin: Skin is warm and dry. She is not diaphoretic.  Psychiatric: She has a normal mood and affect. Her behavior is normal.  Nursing note and vitals reviewed.   ED Course  Procedures (including critical care time) Labs Review Labs Reviewed  URINALYSIS, ROUTINE W REFLEX MICROSCOPIC (NOT AT [facility]) - Abnormal; Notable for the following:    APPearance TURBID (*)    Hgb urine dipstick LARGE (*)    Protein, ur 30 (*)    Leukocytes, UA LARGE (*)    All other components within normal limits  URINE MICROSCOPIC-ADD ON - Abnormal; Notable for the following:    Squamous Epithelial / LPF 0-5 (*)    Bacteria, UA MANY (*)    All other components within normal limits  PREGNANCY, URINE    Imaging Review No results found. I have personally reviewed and evaluated these images and lab results as part of my medical decision-making.   EKG Interpretation None      MDM   Final diagnoses:  UTI (lower urinary tract infection)    25 yo F with a chief complaint of dysuria. Patient is well-appearing nontoxic. Benign abdominal exam. Afebrile.  UTI on ua, given fosfomycin.  D/c home.  With recurrent uti's given script for vantin, will start if symptoms are not improved post 48 hours.   7:26 AM:  I have discussed the diagnosis/risks/treatment options with the patient and believe the pt to be eligible for discharge home to follow-up with PCP. We also discussed returning to the ED immediately if new or worsening sx occur. We discussed the sx which are most concerning (e.g., sudden worsening pain, fever, inability to tolerate by mouth) that necessitate immediate return.  Medications administered to the patient during their visit and any new prescriptions provided to the patient are listed below.  Medications given during this visit Medications  phenazopyridine (PYRIDIUM) tablet 100 mg (not administered)  fosfomycin (MONUROL) packet 3 g (not administered)    New Prescriptions   CEFPODOXIME (VANTIN) 100 MG TABLET    Take 1 tablet (100 mg total) by mouth 2 (two) times daily.   PHENAZOPYRIDINE (PYRIDIUM) 200 MG TABLET    Take 1 tablet (200 mg total) by mouth 3 (three) times daily.    The patient appears reasonably screen and/or stabilized for discharge and I doubt any other medical condition or other [facility] requiring further screening, evaluation, or treatment in the ED at this time prior to discharge.    Melene Plan, DO 09/10/15 [phone]

## 2015-09-10 NOTE — ED Notes (Signed)
Sx onset yesterday, worse this am. No meds PTA.

## 2015-09-10 NOTE — Discharge Instructions (Signed)
Fill the script only if you need it for uti if symptoms not better in 24 hours post abx here.  Follow up with your family doc.  Return for fever, pain in your side, vomiting.  Urinary Tract Infection Urinary tract infections (UTIs) can develop anywhere along your urinary tract. Your urinary tract is your body's drainage system for removing wastes and extra water. Your urinary tract includes two kidneys, two ureters, a bladder, and a urethra. Your kidneys are a pair of bean-shaped organs. Each kidney is about the size of your fist. They are located below your ribs, one on each side of your spine. CAUSES Infections are caused by microbes, which are microscopic organisms, including fungi, viruses, and bacteria. These organisms are so small that they can only be seen through a microscope. Bacteria are the microbes that most commonly cause UTIs. SYMPTOMS  Symptoms of UTIs may vary by age and gender of the patient and by the location of the infection. Symptoms in young women typically include a frequent and intense urge to urinate and a painful, burning feeling in the bladder or urethra during urination. Older women and men are more likely to be tired, shaky, and weak and have muscle aches and abdominal pain. A fever may mean the infection is in your kidneys. Other symptoms of a kidney infection include pain in your back or sides below the ribs, nausea, and vomiting. DIAGNOSIS To diagnose a UTI, your caregiver will ask you about your symptoms. Your caregiver will also ask you to provide a urine sample. The urine sample will be tested for bacteria and white blood cells. White blood cells are made by your body to help fight infection. TREATMENT  Typically, UTIs can be treated with medication. Because most UTIs are caused by a bacterial infection, they usually can be treated with the use of antibiotics. The choice of antibiotic and length of treatment depend on your symptoms and the type of bacteria causing your  infection. HOME CARE INSTRUCTIONS  If you were prescribed antibiotics, take them exactly as your caregiver instructs you. Finish the medication even if you feel better after you have only taken some of the medication.  Drink enough water and fluids to keep your urine clear or pale yellow.  Avoid caffeine, tea, and carbonated beverages. They tend to irritate your bladder.  Empty your bladder often. Avoid holding urine for long periods of time.  Empty your bladder before and after sexual intercourse.  After a bowel movement, women should cleanse from front to back. Use each tissue only once. SEEK MEDICAL CARE IF:   You have back pain.  You develop a fever.  Your symptoms do not begin to resolve within 3 days. SEEK IMMEDIATE MEDICAL CARE IF:   You have severe back pain or lower abdominal pain.  You develop chills.  You have nausea or vomiting.  You have continued burning or discomfort with urination. MAKE SURE YOU:   Understand these instructions.  Will watch your condition.  Will get help right away if you are not doing well or get worse.   This information is not intended to replace advice given to you by your health care provider. Make sure you discuss any questions you have with your health care provider.   Document Released: 05/14/2005 Document Revised: 04/25/2015 Document Reviewed: 09/12/2011 Elsevier Interactive Patient Education [employer]

## 2015-09-10 NOTE — ED Notes (Signed)
MD at bedside. 

## 2015-09-24 ENCOUNTER — Telehealth: Payer: Self-pay | Admitting: Family Medicine

## 2015-09-24 DIAGNOSIS — Z8742 Personal history of other diseases of the female genital tract: Secondary | ICD-10-CM

## 2015-09-24 NOTE — Telephone Encounter (Signed)
Left message for patient to call back with reason as nto why she needs referral to [facility]

## 2015-09-24 NOTE — Telephone Encounter (Signed)
Pt is requesting a referral to a Gyno within the [facility] system.  Please assist further   Thanks.    CB: [phone]

## 2015-09-25 ENCOUNTER — Ambulatory Visit (INDEPENDENT_AMBULATORY_CARE_PROVIDER_SITE_OTHER): Payer: Commercial Managed Care - HMO | Admitting: Physician Assistant

## 2015-09-25 VITALS — BP 120/82 | HR 74 | Temp 98.0°F | Resp 18 | Ht 62.0 in | Wt 136.0 lb

## 2015-09-25 DIAGNOSIS — B9789 Other viral agents as the cause of diseases classified elsewhere: Secondary | ICD-10-CM

## 2015-09-25 DIAGNOSIS — J069 Acute upper respiratory infection, unspecified: Secondary | ICD-10-CM

## 2015-09-25 DIAGNOSIS — J029 Acute pharyngitis, unspecified: Secondary | ICD-10-CM | POA: Diagnosis not present

## 2015-09-25 LAB — POCT RAPID STREP A (OFFICE): RAPID STREP A SCREEN: NEGATIVE

## 2015-09-25 MED ORDER — HYDROCODONE-HOMATROPINE 5-1.5 MG/5ML PO SYRP: 2.5000 mL | ORAL_SOLUTION | Freq: Every evening | ORAL | Status: DC | PRN

## 2015-09-25 NOTE — Progress Notes (Signed)
09/25/2015 9:24 AM   DOB: 04-Nov-1990 / MRN: [id number]  SUBJECTIVE:  Shelly Taylor is a 25 y.o. female presenting for for the evaluation of sore throat that started 7 days ago.  Associated symptoms include congestion, cough, headache and jaw pain today and she denies fever. Treatments tried thus far include Robitussin, dayquil, and vitamins with poor relief. She report sick contacts. She has not had the flu shot.  She reports no change in the illness.    She has No Known Allergies.   She  has a past medical history of Chicken pox; UTI (lower urinary tract infection); HPV in female; and H. pylori infection.    She  reports that she has never smoked. She has never used smokeless tobacco. She reports that she does not drink alcohol or use illicit drugs. She  reports that she currently engages in sexual activity. She reports using the following method of birth control/protection: Pill. The patient  has past surgical history that includes Wisdom tooth extraction (Dec 2011).  Her family history includes Diabetes in her maternal grandfather; Heart disease in her maternal grandfather; Hyperlipidemia in her maternal grandfather; Ovarian cancer in her maternal grandmother; Polycystic kidney disease in her mother; Stroke in her maternal grandfather; Uterine cancer in her maternal grandmother.  Review of Systems  Constitutional: Negative for fever and chills.  Eyes: Negative for blurred vision.  Respiratory: Negative for cough and shortness of breath.   Cardiovascular: Negative for chest pain.  Gastrointestinal: Negative for nausea and abdominal pain.  Genitourinary: Negative for dysuria, urgency and frequency.  Musculoskeletal: Negative for myalgias.  Skin: Negative for rash.  Neurological: Negative for dizziness and tingling.  Psychiatric/Behavioral: Negative for depression. The patient is not nervous/anxious.     Problem list and medications reviewed and updated by myself where necessary, and  exist elsewhere in the encounter.   OBJECTIVE:  BP 120/82 mmHg  Pulse 74  Temp(Src) 98 F (36.7 C) (Oral)  Resp 18  Ht  (1.575 m)  Wt 136 lb (61.689 kg)  BMI 24.87 kg/m2  SpO2 99%  LMP 09/22/2015  Physical Exam  Constitutional: She is oriented to person, place, and time. She appears well-developed.  Eyes: EOM are normal. Pupils are equal, round, and reactive to light.  Cardiovascular: Normal rate and regular rhythm.   Pulmonary/Chest: Effort normal and breath sounds normal.  Abdominal: She exhibits no distension.  Musculoskeletal: Normal range of motion.  Neurological: She is alert and oriented to person, place, and time. No cranial nerve deficit.  Skin: Skin is warm and dry. She is not diaphoretic.  Psychiatric: She has a normal mood and affect.  Vitals reviewed.   Results for orders placed or performed in visit on 09/25/15 (from the past 48 hour(s))  POCT rapid strep A     Status: None   Collection Time: 09/25/15  9:13 AM  Result Value Ref Range   Rapid Strep A Screen Negative Negative    ASSESSMENT AND PLAN:  Shelly Taylor was seen today for sinusitis, sore throat and cough.  Diagnoses and all orders for this visit:  Sore throat: Rapid negative. Her exam is reassuring.  Will treat symptomatically.  Awaiting culu -     POCT rapid strep A -     Culture, Group A Strep  Viral URI with cough -     HYDROcodone-homatropine (HYCODAN) 5-1.5 MG/5ML syrup; Take 2.5-5 mLs by mouth at bedtime as needed.    The patient was advised to call or return to clinic  if she does not see an improvement in symptoms or to seek the care of the closest emergency department if she worsens with the above plan.   Deliah Boston, MHS, PA-C [facility] 09/25/2015 9:24 AM

## 2015-09-25 NOTE — Patient Instructions (Signed)
-   You have a viral upper respiratory infection, (the common cold) and it is not uncommon for symptoms to last 10 days to 2 weeks, regardless of which medications you take. Unfortunately antibiotics will not help your symptoms as they target bacteria, and you are likely suffering from a virus. Additionally, 1 in 8 people who take antibiotics suffer mild to severe side effects.    - You would benefit from high dose ibuprofen. TAKE 600-800 mg of ibuprofen every 8 hours as needed to control low grade fever, fatigue, and pain.    - I also recommend that you take Zyrtec-D 5-120 every morning or Claritin-D 10-240 for the next five to 10 days. This medication will help you with nasal congestion, post nasal drip and sneezing. You will have to purchase this directly from the pharmacist   Viral URI Medications: Please consult the pharmacist if you have questions. 600-800 mg ibuprofen every 8 hours as needed for the next five to ten days 5-120 Zyrtec-D every morning for five to 10 days OR Claritin D 10-240 every morning for five days.  Please visit the CDC's website https://www.cdc.gov/getsmart/ to learn about appropriate and inappropriate uses of antibiotics.    

## 2015-09-26 LAB — CULTURE, GROUP A STREP: ORGANISM ID, BACTERIA: NORMAL

## 2015-09-26 NOTE — Telephone Encounter (Signed)
Referral placed to [facility] per phone note on 04/24/15

## 2015-09-26 NOTE — Telephone Encounter (Signed)
Referral sent to [facility]/awaiting appt

## 2015-10-15 ENCOUNTER — Telehealth: Payer: Self-pay | Admitting: Family Medicine

## 2015-10-15 NOTE — Telephone Encounter (Signed)
Patient declined a flu shot.  

## 2015-10-15 NOTE — Telephone Encounter (Signed)
Chart updated to reflect 

## 2015-10-17 ENCOUNTER — Ambulatory Visit (INDEPENDENT_AMBULATORY_CARE_PROVIDER_SITE_OTHER): Payer: Commercial Managed Care - HMO | Admitting: Women's Health

## 2015-10-17 ENCOUNTER — Encounter: Payer: Self-pay | Admitting: Women's Health

## 2015-10-17 VITALS — BP 112/78 | Ht 63.0 in | Wt 139.0 lb

## 2015-10-17 DIAGNOSIS — Z01419 Encounter for gynecological examination (general) (routine) without abnormal findings: Secondary | ICD-10-CM | POA: Diagnosis not present

## 2015-10-17 DIAGNOSIS — B9689 Other specified bacterial agents as the cause of diseases classified elsewhere: Secondary | ICD-10-CM

## 2015-10-17 DIAGNOSIS — B373 Candidiasis of vulva and vagina: Secondary | ICD-10-CM

## 2015-10-17 DIAGNOSIS — A499 Bacterial infection, unspecified: Secondary | ICD-10-CM

## 2015-10-17 DIAGNOSIS — Z23 Encounter for immunization: Secondary | ICD-10-CM | POA: Diagnosis not present

## 2015-10-17 DIAGNOSIS — Z3041 Encounter for surveillance of contraceptive pills: Secondary | ICD-10-CM

## 2015-10-17 DIAGNOSIS — B3731 Acute candidiasis of vulva and vagina: Secondary | ICD-10-CM

## 2015-10-17 DIAGNOSIS — Z113 Encounter for screening for infections with a predominantly sexual mode of transmission: Secondary | ICD-10-CM

## 2015-10-17 DIAGNOSIS — N760 Acute vaginitis: Secondary | ICD-10-CM | POA: Diagnosis not present

## 2015-10-17 LAB — WET PREP FOR TRICH, YEAST, CLUE
Trich, Wet Prep: NONE SEEN
WBC WET PREP: NONE SEEN
Yeast Wet Prep HPF POC: NONE SEEN

## 2015-10-17 LAB — CBC WITH DIFFERENTIAL/PLATELET
BASOS PCT: 1 % (ref 0–1)
Basophils Absolute: 0.1 10*3/uL (ref 0.0–0.1)
EOS ABS: 0.4 10*3/uL (ref 0.0–0.7)
Eosinophils Relative: 5 % (ref 0–5)
HCT: 36.3 % (ref 36.0–46.0)
Hemoglobin: 12.7 g/dL (ref 12.0–15.0)
Lymphocytes Relative: 27 % (ref 12–46)
Lymphs Abs: 2.3 10*3/uL (ref 0.7–4.0)
MCH: 30.1 pg (ref 26.0–34.0)
MCHC: 35 g/dL (ref 30.0–36.0)
MCV: 86 fL (ref 78.0–100.0)
MONO ABS: 0.8 10*3/uL (ref 0.1–1.0)
MONOS PCT: 9 % (ref 3–12)
MPV: 10.5 fL (ref 8.6–12.4)
Neutro Abs: 5 10*3/uL (ref 1.7–7.7)
Neutrophils Relative %: 58 % (ref 43–77)
PLATELETS: 359 10*3/uL (ref 150–400)
RBC: 4.22 MIL/uL (ref 3.87–5.11)
RDW: 13 % (ref 11.5–15.5)
WBC: 8.6 10*3/uL (ref 4.0–10.5)

## 2015-10-17 LAB — HEPATITIS B SURFACE ANTIGEN: HEP B S AG: NEGATIVE

## 2015-10-17 LAB — HEPATITIS C ANTIBODY: HCV Ab: NEGATIVE

## 2015-10-17 LAB — HIV ANTIBODY (ROUTINE TESTING W REFLEX): HIV 1&2 Ab, 4th Generation: NONREACTIVE

## 2015-10-17 MED ORDER — FLUCONAZOLE 150 MG PO TABS: 150.0000 mg | ORAL_TABLET | Freq: Once | ORAL | Status: DC

## 2015-10-17 MED ORDER — METRONIDAZOLE 0.75 % VA GEL: VAGINAL | Status: DC

## 2015-10-17 MED ORDER — DROSPIRENONE-ETHINYL ESTRADIOL 3-0.03 MG PO TABS: 1.0000 | ORAL_TABLET | Freq: Every day | ORAL | Status: DC

## 2015-10-17 NOTE — Patient Instructions (Signed)
HPV (Human Papillomavirus) Vaccine--Gardasil-9:  1. Why get vaccinated? Gardasil-9 prevents human papillomavirus (HPV) types that cause many cancers, including:  cervical cancer in females,  vaginal and vulvar cancers in females,  anal cancer in females and males,  throat cancer in females and males, and  penile cancer in males. In addition, Gardasil-9 prevents HPV types that cause genital warts in both females and males. In the [place] about 12,000 women get cervical cancer every year, and about 4,000 women die from it. Shelly Taylor can prevent most of these cases of cervical cancer. Vaccination is not a substitute for cervical cancer screening. This vaccine does not protect against all HPV types that can cause cervical cancer. Women should still get regular Pap tests. HPV infection usually comes from sexual contact, and most people will become infected at some point in their life. About 14 million Americans, including teens, get infected every year. Most infections will go away and not cause serious problems. But thousands of women and men get cancer and diseases from HPV. 2. HPV vaccine Shelly Taylor is an FDA-approved HPV vaccine. It is recommended for both males and females. It is routinely given at 61 or 24 years of age, but it may be given beginning at age 13 years through age 51 years. Three doses of Gardasil-9 are recommended with the second dose given 1-2 months after the first dose and the third dose given 6 months after the first dose. 3. Some people should not get this vaccine  Anyone who has had a severe, life-threatening allergic reaction to a dose of HPV vaccine should not get another dose.  Anyone who has a severe (life threatening) allergy to any component of HPV vaccine should not get the vaccine. Tell your doctor if you have any severe allergies that you know of, including a severe allergy to yeast.  HPV vaccine is not recommended for pregnant women. If you learn that you were  pregnant when you were vaccinated, there is no reason to expect any problems for you or your baby. Any woman who learns she was pregnant when she got Gardasil-9 vaccine is encouraged to contact the manufacturer's registry for HPV vaccination during pregnancy at [phone]. Women who are breastfeeding may be vaccinated.  If you have a mild illness, such as a cold, you can probably get the vaccine today. If you are moderately or severely ill, you should probably wait until you recover. Your doctor can advise you. 4. Risks of a vaccine reaction With any medicine, including vaccines, there is a chance of side effects. These are usually mild and go away on their own, but serious reactions are also possible. Most people who get HPV vaccine do not have any serious problems with it. Mild or moderate problems following Gardasil-9:  Reactions in the arm where the shot was given:  Soreness (about 9 people in 10)  Redness or swelling (about 1 person in 3)  Fever:  Mild (100F) (about 1 person in 10)  Moderate (102F) (about 1 person in 10)  Other problems:  Headache (about 1 person in 3) Problems that could happen after any injected vaccine:  People sometimes faint after a medical procedure, including vaccination. Sitting or lying down for about 15 minutes can help prevent fainting, and injuries caused by a fall. Tell your doctor if you feel dizzy, or have vision changes or ringing in the ears.  Some people get severe pain in the shoulder and have difficulty moving the arm where a shot was given. This happens  very rarely.  Any medication can cause a severe allergic reaction. Such reactions from a vaccine are very rare, estimated at about 1 in a million doses, and would happen within a few minutes to a few hours after the vaccination. As with any medicine, there is a very remote chance of a vaccine causing a serious injury or death. The safety of vaccines is always being monitored. For more  information, visit: http://www.aguilar.org/. 5. What if there is a serious reaction? What should I look for? Look for anything that concerns you, such as signs of a severe allergic reaction, very high fever, or unusual behavior. Signs of a severe allergic reaction can include hives, swelling of the face and throat, difficulty breathing, a fast heartbeat, dizziness, and weakness. These would usually start a few minutes to a few hours after the vaccination. What should I do? If you think it is a severe allergic reaction or other emergency that can't wait, call 9-1-1 or get to the nearest hospital. Otherwise, call your doctor. Afterward, the reaction should be reported to the "Vaccine Adverse Event Reporting System" (VAERS). Your doctor might file this report, or you can do it yourself through the VAERS web site at www.vaers.SamedayNews.es, or by calling [phone]. VAERS does not give medical advice. 6. The National Vaccine Injury Compensation Program The [employer] Vaccine Injury Compensation Program (VICP) is a federal program that was created to compensate people who may have been injured by certain vaccines. Persons who believe they may have been injured by a vaccine can learn about the program and about filing a claim by calling 5632428544 or visiting the [place] website at GoldCloset.com.ee. There is a time limit to file a claim for compensation. 7. How can I learn more?  Ask your health care provider. He or she can give you the vaccine package insert or suggest other sources of information.  Call your local or state health department.  Contact the Centers for Disease Control and Prevention (CDC):  Call 8630385372 (1-800-CDC-INFO) or  Visit CDC's website at http://sweeney-todd.com/ Vaccine Information Statement HPV Vaccine Shelly Taylor) 11/16/14   This information is not intended to replace advice given to you by your health care provider. Make sure you discuss any questions you  have with your health care provider.   Document Released: 03/01/2014 Document Revised: 12/19/2014 Document Reviewed: 03/01/2014 Elsevier Interactive Patient Education 2016 Else[employer] Inc. Bacterial Vaginosis Bacterial vaginosis is a vaginal infection that occurs when the normal balance of bacteria in the vagina is disrupted. It results from an overgrowth of certain bacteria. This is the most common vaginal infection in women of childbearing age. Treatment is important to prevent complications, especially in pregnant women, as it can cause a premature delivery. CAUSES  Bacterial vaginosis is caused by an increase in harmful bacteria that are normally present in smaller amounts in the vagina. Several different kinds of bacteria can cause bacterial vaginosis. However, the reason that the condition develops is not fully understood. RISK FACTORS Certain activities or behaviors can put you at an increased risk of developing bacterial vaginosis, including:  Having a new sex partner or multiple sex partners.  Douching.  Using an intrauterine device (IUD) for contraception. Women do not get bacterial vaginosis from toilet seats, bedding, swimming pools, or contact with objects around them. SIGNS AND SYMPTOMS  Some women with bacterial vaginosis have no signs or symptoms. Common symptoms include:  Grey vaginal discharge.  A fishlike odor with discharge, especially after sexual intercourse.  Itching or burning of the vagina  and vulva.  Burning or pain with urination. DIAGNOSIS  Your health care provider will take a medical history and examine the vagina for signs of bacterial vaginosis. A sample of vaginal fluid may be taken. Your health care provider will look at this sample under a microscope to check for bacteria and abnormal cells. A vaginal pH test may also be done.  TREATMENT  Bacterial vaginosis may be treated with antibiotic medicines. These may be given in the form of a pill or a vaginal  cream. A second round of antibiotics may be prescribed if the condition comes back after treatment. Because bacterial vaginosis increases your risk for sexually transmitted diseases, getting treated can help reduce your risk for chlamydia, gonorrhea, HIV, and herpes. HOME CARE INSTRUCTIONS   Only take over-the-counter or prescription medicines as directed by your health care provider.  If antibiotic medicine was prescribed, take it as directed. Make sure you finish it even if you start to feel better.  Tell all sexual partners that you have a vaginal infection. They should see their health care provider and be treated if they have problems, such as a mild rash or itching.  During treatment, it is important that you follow these instructions:  Avoid sexual activity or use condoms correctly.  Do not douche.  Avoid alcohol as directed by your health care provider.  Avoid breastfeeding as directed by your health care provider. SEEK MEDICAL CARE IF:   Your symptoms are not improving after 3 days of treatment.  You have increased discharge or pain.  You have a fever. MAKE SURE YOU:   Understand these instructions.  Will watch your condition.  Will get help right away if you are not doing well or get worse. FOR MORE INFORMATION  Centers for Disease Control and Prevention, Division of STD Prevention: AppraiserFraud.fi American Sexual Health Association (ASHA): www.ashastd.org    This information is not intended to replace advice given to you by your health care provider. Make sure you discuss any questions you have with your health care provider.   Document Released: 08/04/2005 Document Revised: 08/25/2014 Document Reviewed: 03/16/2013 Elsevier Interactive Patient Education 2016 [place] Maintenance, Female Adopting a healthy lifestyle and getting preventive care can go a long way to promote health and wellness. Talk with your health care provider about what schedule of  regular examinations is right for you. This is a good chance for you to check in with your provider about disease prevention and staying healthy. In between checkups, there are plenty of things you can do on your own. Experts have done a lot of research about which lifestyle changes and preventive measures are most likely to keep you healthy. Ask your health care provider for more information. WEIGHT AND DIET  Eat a healthy diet  Be sure to include plenty of vegetables, fruits, low-fat dairy products, and lean protein.  Do not eat a lot of foods high in solid fats, added sugars, or salt.  Get regular exercise. This is one of the most important things you can do for your health.  Most adults should exercise for at least 150 minutes each week. The exercise should increase your heart rate and make you sweat (moderate-intensity exercise).  Most adults should also do strengthening exercises at least twice a week. This is in addition to the moderate-intensity exercise.  Maintain a healthy weight  Body mass index (BMI) is a measurement that can be used to identify possible weight problems. It estimates body fat based on  height and weight. Your health care provider can help determine your BMI and help you achieve or maintain a healthy weight.  For females 91 years of age and older:   A BMI below 18.5 is considered underweight.  A BMI of 18.5 to 24.9 is normal.  A BMI of 25 to 29.9 is considered overweight.  A BMI of 30 and above is considered obese.  Watch levels of cholesterol and blood lipids  You should start having your blood tested for lipids and cholesterol at 24 years of age, then have this test every 5 years.  You may need to have your cholesterol levels checked more often if:  Your lipid or cholesterol levels are high.  You are older than 24 years of age.  You are at high risk for heart disease.  CANCER SCREENING   Lung Cancer  Lung cancer screening is recommended for  adults 17-68 years old who are at high risk for lung cancer because of a history of smoking.  A yearly low-dose CT scan of the lungs is recommended for people who:  Currently smoke.  Have quit within the past 15 years.  Have at least a 30-pack-year history of smoking. A pack year is smoking an average of one pack of cigarettes a day for 1 year.  Yearly screening should continue until it has been 15 years since you quit.  Yearly screening should stop if you develop a health problem that would prevent you from having lung cancer treatment.  Breast Cancer  Practice breast self-awareness. This means understanding how your breasts normally appear and feel.  It also means doing regular breast self-exams. Let your health care provider know about any changes, no matter how small.  If you are in your 20s or 30s, you should have a clinical breast exam (CBE) by a health care provider every 1-3 years as part of a regular health exam.  If you are 81 or older, have a CBE every year. Also consider having a breast X-ray (mammogram) every year.  If you have a family history of breast cancer, talk to your health care provider about genetic screening.  If you are at high risk for breast cancer, talk to your health care provider about having an MRI and a mammogram every year.  Breast cancer gene (BRCA) assessment is recommended for women who have family members with BRCA-related cancers. BRCA-related cancers include:  Breast.  Ovarian.  Tubal.  Peritoneal cancers.  Results of the assessment will determine the need for genetic counseling and BRCA1 and BRCA2 testing. Cervical Cancer Your health care provider may recommend that you be screened regularly for cancer of the pelvic organs (ovaries, uterus, and vagina). This screening involves a pelvic examination, including checking for microscopic changes to the surface of your cervix (Pap test). You may be encouraged to have this screening done every  3 years, beginning at age 66.  For women ages 60-65, health care providers may recommend pelvic exams and Pap testing every 3 years, or they may recommend the Pap and pelvic exam, combined with testing for human papilloma virus (HPV), every 5 years. Some types of HPV increase your risk of cervical cancer. Testing for HPV may also be done on women of any age with unclear Pap test results.  Other health care providers may not recommend any screening for nonpregnant women who are considered low risk for pelvic cancer and who do not have symptoms. Ask your health care provider if a screening pelvic exam is  right for you.  If you have had past treatment for cervical cancer or a condition that could lead to cancer, you need Pap tests and screening for cancer for at least 20 years after your treatment. If Pap tests have been discontinued, your risk factors (such as having a new sexual partner) need to be reassessed to determine if screening should resume. Some women have medical problems that increase the chance of getting cervical cancer. In these cases, your health care provider may recommend more frequent screening and Pap tests. Colorectal Cancer  This type of cancer can be detected and often prevented.  Routine colorectal cancer screening usually begins at 24 years of age and continues through 24 years of age.  Your health care provider may recommend screening at an earlier age if you have risk factors for colon cancer.  Your health care provider may also recommend using home test kits to check for hidden blood in the stool.  A small camera at the end of a tube can be used to examine your colon directly (sigmoidoscopy or colonoscopy). This is done to check for the earliest forms of colorectal cancer.  Routine screening usually begins at age 82.  Direct examination of the colon should be repeated every 5-10 years through 24 years of age. However, you may need to be screened more often if early  forms of precancerous polyps or small growths are found. Skin Cancer  Check your skin from head to toe regularly.  Tell your health care provider about any new moles or changes in moles, especially if there is a change in a mole's shape or color.  Also tell your health care provider if you have a mole that is larger than the size of a pencil eraser.  Always use sunscreen. Apply sunscreen liberally and repeatedly throughout the day.  Protect yourself by wearing long sleeves, pants, a wide-brimmed hat, and sunglasses whenever you are outside. HEART DISEASE, DIABETES, AND HIGH BLOOD PRESSURE   High blood pressure causes heart disease and increases the risk of stroke. High blood pressure is more likely to develop in:  People who have blood pressure in the high end of the normal range (130-139/85-89 mm Hg).  People who are overweight or obese.  People who are African American.  If you are 17-18 years of age, have your blood pressure checked every 3-5 years. If you are 31 years of age or older, have your blood pressure checked every year. You should have your blood pressure measured twice--once when you are at a hospital or clinic, and once when you are not at a hospital or clinic. Record the average of the two measurements. To check your blood pressure when you are not at a hospital or clinic, you can use:  An automated blood pressure machine at a pharmacy.  A home blood pressure monitor.  If you are between 45 years and 12 years old, ask your health care provider if you should take aspirin to prevent strokes.  Have regular diabetes screenings. This involves taking a blood sample to check your fasting blood sugar level.  If you are at a normal weight and have a low risk for diabetes, have this test once every three years after 24 years of age.  If you are overweight and have a high risk for diabetes, consider being tested at a younger age or more often. PREVENTING INFECTION  Hepatitis  B  If you have a higher risk for hepatitis B, you should be screened for  this virus. You are considered at high risk for hepatitis B if:  You were born in a country where hepatitis B is common. Ask your health care provider which countries are considered high risk.  Your parents were born in a high-risk country, and you have not been immunized against hepatitis B (hepatitis B vaccine).  You have HIV or AIDS.  You use needles to inject street drugs.  You live with someone who has hepatitis B.  You have had sex with someone who has hepatitis B.  You get hemodialysis treatment.  You take certain medicines for conditions, including cancer, organ transplantation, and autoimmune conditions. Hepatitis C  Blood testing is recommended for:  Everyone born from [birth year] through 1964.  Anyone with known risk factors for hepatitis C. Sexually transmitted infections (STIs)  You should be screened for sexually transmitted infections (STIs) including gonorrhea and chlamydia if:  You are sexually active and are younger than 24 years of age.  You are older than 24 years of age and your health care provider tells you that you are at risk for this type of infection.  Your sexual activity has changed since you were last screened and you are at an increased risk for chlamydia or gonorrhea. Ask your health care provider if you are at risk.  If you do not have HIV, but are at risk, it may be recommended that you take a prescription medicine daily to prevent HIV infection. This is called pre-exposure prophylaxis (PrEP). You are considered at risk if:  You are sexually active and do not regularly use condoms or know the HIV status of your partner(s).  You take drugs by injection.  You are sexually active with a partner who has HIV. Talk with your health care provider about whether you are at high risk of being infected with HIV. If you choose to begin PrEP, you should first be tested for HIV. You should  then be tested every 3 months for as long as you are taking PrEP.  PREGNANCY   If you are premenopausal and you may become pregnant, ask your health care provider about preconception counseling.  If you may become pregnant, take 400 to 800 micrograms (mcg) of folic acid every day.  If you want to prevent pregnancy, talk to your health care provider about birth control (contraception). OSTEOPOROSIS AND MENOPAUSE   Osteoporosis is a disease in which the bones lose minerals and strength with aging. This can result in serious bone fractures. Your risk for osteoporosis can be identified using a bone density scan.  If you are 75 years of age or older, or if you are at risk for osteoporosis and fractures, ask your health care provider if you should be screened.  Ask your health care provider whether you should take a calcium or vitamin D supplement to lower your risk for osteoporosis.  Menopause may have certain physical symptoms and risks.  Hormone replacement therapy may reduce some of these symptoms and risks. Talk to your health care provider about whether hormone replacement therapy is right for you.  HOME CARE INSTRUCTIONS   Schedule regular health, dental, and eye exams.  Stay current with your immunizations.   Do not use any tobacco products including cigarettes, chewing tobacco, or electronic cigarettes.  If you are pregnant, do not drink alcohol.  If you are breastfeeding, limit how much and how often you drink alcohol.  Limit alcohol intake to no more than 1 drink per day for nonpregnant women. One drink  equals 12 ounces of beer, 5 ounces of wine, or 1 ounces of hard liquor.  Do not use street drugs.  Do not share needles.  Ask your health care provider for help if you need support or information about quitting drugs.  Tell your health care provider if you often feel depressed.  Tell your health care provider if you have ever been abused or do not feel safe at home.     This information is not intended to replace advice given to you by your health care provider. Make sure you discuss any questions you have with your health care provider.   Document Released: 02/17/2011 Document Revised: 08/25/2014 Document Reviewed: 07/06/2013 Elsevier Interactive Patient Education [employer].

## 2015-10-17 NOTE — Progress Notes (Addendum)
Shelly Taylor September 01, 1990 [id number]    History:    Presents for annual exam.  Regular monthly cycle/new partner. Has not received gardasil. History of abnormal Pap without needed treatment after colposcopy and biopsy. History of polycystic kidney disease and does see a urologist annually, no limitations or problems at this time. Mother history of polycystic kidney also without problem.  Past medical history, past surgical history, family history and social history were all reviewed and documented in the EPIC chart. 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 Father healthy.  ROS:  A ROS was performed and pertinent positives and negatives are included.  Exam:  Filed Vitals:   10/17/15 1406  BP: 112/78    General appearance:  Normal Thyroid:  Symmetrical, normal in size, without palpable masses or nodularity. Respiratory  Auscultation:  Clear without wheezing or rhonchi Cardiovascular  Auscultation:  Regular rate, without rubs, murmurs or gallops  Edema/varicosities:  Not grossly evident Abdominal  Soft,nontender, without masses, guarding or rebound.  Liver/spleen:  No organomegaly noted  Hernia:  None appreciated  Skin  Inspection:  Grossly normal   Breasts: Examined lying and sitting.     Right: Without masses, retractions, discharge or axillary adenopathy.     Left: Without masses, retractions, discharge or axillary adenopathy. Gentitourinary   Inguinal/mons:  Normal without inguinal adenopathy  External genitalia:  Normal  BUS/Urethra/Skene's glands:  Normal  Vagina: White adherent discharge, wet prep positive for moderate clues, TNTC bacteria  Cervix:  Normal  Uterus:   normal in size, shape and contour.  Midline and mobile  Adnexa/parametria:     Rt: Without masses or tenderness.   Lt: Without masses or tenderness.  Anus and perineum: Normal    Assessment/Plan:  25 y.o. S WF G0 for annual exam with no complaints.  Regular monthly cycle on  Yasmin STD screen History of polycystic kidney disease without problem-urologist Gardasil Bacteria vaginosis  Plan: Gardasil information given and reviewed first given instructed to return to office in 2 and 6 months to complete series. Yasmin prescription, proper use, slight risk for blood clots and strokes reviewed, condoms encouraged until permanent partner. MetroGel vaginal cream 1 applicator at bedtime 5, alcohol precautions reviewed all if no relief of discharge. SBE's, continue regular exercise, calcium rich diet, MVI daily encouraged. CBC, UA, Pap, GC/Chlamydia, HIV, hep B, C, RPRHarrington Challenger WHNP, 4:52 PM 10/17/2015

## 2015-10-18 ENCOUNTER — Encounter: Payer: Self-pay | Admitting: Family Medicine

## 2015-10-18 LAB — URINALYSIS W MICROSCOPIC + REFLEX CULTURE
BILIRUBIN URINE: NEGATIVE
Casts: NONE SEEN [LPF]
Crystals: NONE SEEN [HPF]
GLUCOSE, UA: NEGATIVE
Hgb urine dipstick: NEGATIVE
Ketones, ur: NEGATIVE
Nitrite: NEGATIVE
PH: 7 (ref 5.0–8.0)
Protein, ur: NEGATIVE
RBC / HPF: NONE SEEN RBC/HPF (ref <=2)
SPECIFIC GRAVITY, URINE: 1.014 (ref 1.001–1.035)
Yeast: NONE SEEN [HPF]

## 2015-10-18 LAB — RPR

## 2015-10-18 LAB — GC/CHLAMYDIA PROBE AMP
CT Probe RNA: DETECTED — AB
GC PROBE AMP APTIMA: NOT DETECTED

## 2015-10-19 ENCOUNTER — Other Ambulatory Visit: Payer: Self-pay | Admitting: Women's Health

## 2015-10-19 DIAGNOSIS — A749 Chlamydial infection, unspecified: Secondary | ICD-10-CM

## 2015-10-19 LAB — URINE CULTURE
Colony Count: NO GROWTH
Organism ID, Bacteria: NO GROWTH

## 2015-10-19 LAB — PAP IG W/ RFLX HPV ASCU

## 2015-10-19 MED ORDER — AZITHROMYCIN 500 MG PO TABS
1000.0000 mg | ORAL_TABLET | Freq: Every day | ORAL | Status: DC
Start: 2015-10-19 — End: 2015-11-12

## 2015-11-12 ENCOUNTER — Ambulatory Visit (INDEPENDENT_AMBULATORY_CARE_PROVIDER_SITE_OTHER): Payer: Commercial Managed Care - HMO | Admitting: Women's Health

## 2015-11-12 ENCOUNTER — Encounter: Payer: Self-pay | Admitting: Women's Health

## 2015-11-12 VITALS — BP 118/78 | Ht 62.0 in | Wt 136.0 lb

## 2015-11-12 DIAGNOSIS — Z202 Contact with and (suspected) exposure to infections with a predominantly sexual mode of transmission: Secondary | ICD-10-CM | POA: Insufficient documentation

## 2015-11-12 NOTE — Progress Notes (Signed)
Patient ID: Vinnie LangtonHannah Taylor, female   DOB: 07/04/1991, 25 y.o.   MRN: [id number] Presents for test of cure asymptomatic chlamydia. Has abstained, partner treated. Had negative HIV, hepatitis and RPR. Monthly cycle on OCs without complaint.  Exam: Appears well. External genitalia within normal limits, speculum exam scant discharge without erythema or odor, GC/Chlamydia culture taken.  Test to cure chlamydia  Plan: Encouraged condoms until permanent partner. Reviewed slight increased risk for ectopic, seek prenatal care early when pregnant.

## 2015-11-14 NOTE — Addendum Note (Signed)
Addended by: Harrington ChallengerYOUNG, Tinnie Kunin J on: 11/14/2015 12:49 PM   Modules accepted: Orders

## 2015-11-15 ENCOUNTER — Telehealth: Payer: Self-pay

## 2015-11-15 ENCOUNTER — Other Ambulatory Visit: Payer: Self-pay | Admitting: Women's Health

## 2015-11-15 LAB — GC/CHLAMYDIA PROBE AMP
CT PROBE, AMP APTIMA: DETECTED — AB
GC PROBE AMP APTIMA: NOT DETECTED

## 2015-11-15 MED ORDER — FLUCONAZOLE 150 MG PO TABS: 150.0000 mg | ORAL_TABLET | Freq: Once | ORAL | Status: DC

## 2015-11-15 MED ORDER — DOXYCYCLINE HYCLATE 100 MG PO CAPS
100.0000 mg | ORAL_CAPSULE | Freq: Two times a day (BID) | ORAL | Status: DC
Start: 2015-11-15 — End: 2015-11-16

## 2015-11-15 NOTE — Telephone Encounter (Signed)
Message left

## 2015-11-15 NOTE — Telephone Encounter (Signed)
-----   Message from Harrington ChallengerNancy J Young, NP sent at 11/15/2015 10:16 AM EDT ----- Please call and inform chlamydia still pos   Doxycyline 100 mg bid for 7 days ( and diflucan 150 mg if needed for yeast)   Return to office in 3 weeks for TOC, abstain

## 2015-11-15 NOTE — Telephone Encounter (Signed)
Patient was informed of result. She said she is very confused because she has not been sexually active at all since original diagnosis and her partner has been treated.  She questioned how it is still positive and is this common?   Rx's as indicated below still?

## 2015-11-15 NOTE — Telephone Encounter (Signed)
Phone call, reviewed occasionally Chlamydia is difficult to treat, will take the doxycycline return to office in 3 weeks for test of cure.

## 2015-11-16 ENCOUNTER — Encounter: Payer: Self-pay | Admitting: Family Medicine

## 2015-11-16 ENCOUNTER — Ambulatory Visit (INDEPENDENT_AMBULATORY_CARE_PROVIDER_SITE_OTHER): Payer: Commercial Managed Care - HMO | Admitting: Family Medicine

## 2015-11-16 VITALS — BP 110/70 | HR 76 | Temp 98.7°F | Resp 22 | Ht 62.0 in | Wt 139.4 lb

## 2015-11-16 DIAGNOSIS — Z0000 Encounter for general adult medical examination without abnormal findings: Secondary | ICD-10-CM | POA: Diagnosis not present

## 2015-11-16 LAB — LIPID PANEL
CHOL/HDL RATIO: 3
Cholesterol: 173 mg/dL (ref 0–200)
HDL: 64.7 mg/dL (ref >39.00)
LDL Cholesterol: 93 mg/dL (ref 0–99)
NONHDL: 107.86
Triglycerides: 75 mg/dL (ref 0.0–149.0)
VLDL: 15 mg/dL (ref 0.0–40.0)

## 2015-11-16 LAB — BASIC METABOLIC PANEL
BUN: 9 mg/dL (ref 6–23)
CALCIUM: 9.2 mg/dL (ref 8.4–10.5)
CHLORIDE: 105 meq/L (ref 96–112)
CO2: 26 meq/L (ref 19–32)
CREATININE: 0.75 mg/dL (ref 0.40–1.20)
GFR: 101.06 mL/min (ref >60.00)
Glucose, Bld: 90 mg/dL (ref 70–99)
Potassium: 3.6 mEq/L (ref 3.5–5.1)
SODIUM: 137 meq/L (ref 135–145)

## 2015-11-16 LAB — CBC WITH DIFFERENTIAL/PLATELET
Basophils Absolute: 0.1 10*3/uL (ref 0.0–0.1)
Basophils Relative: 1.2 % (ref 0.0–3.0)
EOS ABS: 0 10*3/uL (ref 0.0–0.7)
Eosinophils Relative: 0.7 % (ref 0.0–5.0)
HCT: 36.7 % (ref 36.0–46.0)
HEMOGLOBIN: 12.5 g/dL (ref 12.0–15.0)
LYMPHS PCT: 18.9 % (ref 12.0–46.0)
Lymphs Abs: 0.9 10*3/uL (ref 0.7–4.0)
MCHC: 34.1 g/dL (ref 30.0–36.0)
MCV: 86.8 fl (ref 78.0–100.0)
MONO ABS: 0.3 10*3/uL (ref 0.1–1.0)
Monocytes Relative: 7.4 % (ref 3.0–12.0)
Neutro Abs: 3.3 10*3/uL (ref 1.4–7.7)
Neutrophils Relative %: 71.8 % (ref 43.0–77.0)
Platelets: 241 10*3/uL (ref 150.0–400.0)
RBC: 4.23 Mil/uL (ref 3.87–5.11)
RDW: 12.6 % (ref 11.5–15.5)
WBC: 4.6 10*3/uL (ref 4.0–10.5)

## 2015-11-16 LAB — HEPATIC FUNCTION PANEL
ALT: 19 U/L (ref 0–35)
AST: 21 U/L (ref 0–37)
Albumin: 4 g/dL (ref 3.5–5.2)
Alkaline Phosphatase: 41 U/L (ref 39–117)
BILIRUBIN DIRECT: 0 mg/dL (ref 0.0–0.3)
BILIRUBIN TOTAL: 0.3 mg/dL (ref 0.2–1.2)
Total Protein: 6.8 g/dL (ref 6.0–8.3)

## 2015-11-16 LAB — TSH: TSH: 0.72 u[IU]/mL (ref 0.35–4.50)

## 2015-11-16 LAB — VITAMIN D 25 HYDROXY (VIT D DEFICIENCY, FRACTURES): VITD: 25.95 ng/mL — AB (ref 30.00–100.00)

## 2015-11-16 NOTE — Progress Notes (Signed)
   Subjective:    Patient ID: Shelly LangtonHannah Babineaux, female    DOB: 03/20/1991, 25 y.o.   MRN: [id number]  HPI CPE- UTD on pap.  No current concerns.   Review of Systems Patient reports no vision/ hearing changes, adenopathy,fever, weight change,  persistant/recurrent hoarseness , swallowing issues, chest pain, palpitations, edema, persistant/recurrent cough, hemoptysis, dyspnea (rest/exertional/paroxysmal nocturnal), gastrointestinal bleeding (melena, rectal bleeding), abdominal pain, significant heartburn, bowel changes, GU symptoms (dysuria, hematuria, incontinence), Gyn symptoms (abnormal  bleeding, pain),  syncope, focal weakness, memory loss, numbness & tingling, skin/hair/nail changes, abnormal bruising or bleeding, anxiety, or depression.     Objective:   Physical Exam General Appearance:    Alert, cooperative, no distress, appears stated age  Head:    Normocephalic, without obvious abnormality, atraumatic  Eyes:    PERRL, conjunctiva/corneas clear, EOM's intact, fundi    benign, both eyes  Ears:    Normal TM's and external ear canals, both ears  Nose:   Nares normal, septum midline, mucosa normal, no drainage    or sinus tenderness  Throat:   Lips, mucosa, and tongue normal; teeth and gums normal  Neck:   Supple, symmetrical, trachea midline, no adenopathy;    Thyroid: no enlargement/tenderness/nodules  Back:     Symmetric, no curvature, ROM normal, no CVA tenderness  Lungs:     Clear to auscultation bilaterally, respirations unlabored  Chest Wall:    No tenderness or deformity   Heart:    Regular rate and rhythm, S1 and S2 normal, no murmur, rub   or gallop  Breast Exam:    Deferred to GYN  Abdomen:     Soft, non-tender, bowel sounds active all four quadrants,    no masses, no organomegaly  Genitalia:    Deferred to GYN  Rectal:    Extremities:   Extremities normal, atraumatic, no cyanosis or edema  Pulses:   2+ and symmetric all extremities  Skin:   Skin color, texture, turgor  normal, no rashes or lesions  Lymph nodes:   Cervical, supraclavicular, and axillary nodes normal  Neurologic:   CNII-XII intact, normal strength, sensation and reflexes    throughout          Assessment & Plan:

## 2015-11-16 NOTE — Patient Instructions (Signed)
Follow up in 1 year or as needed Keep up the good work!  You look great! We'll notify you of your lab results and make any changes if needed Call with any questions or concerns Thanks for sticking with us! Happy Spring!!!

## 2015-11-16 NOTE — Assessment & Plan Note (Signed)
Pt's PE WNL.  UTD on GYN.  Check labs.  Anticipatory guidance provided.  

## 2015-11-19 ENCOUNTER — Other Ambulatory Visit: Payer: Self-pay | Admitting: General Practice

## 2015-11-19 MED ORDER — VITAMIN D (ERGOCALCIFEROL) 1.25 MG (50000 UNIT) PO CAPS: 50000.0000 [IU] | ORAL_CAPSULE | ORAL | Status: DC

## 2015-12-17 ENCOUNTER — Ambulatory Visit (INDEPENDENT_AMBULATORY_CARE_PROVIDER_SITE_OTHER): Payer: Commercial Managed Care - HMO | Admitting: Women's Health

## 2015-12-17 ENCOUNTER — Encounter: Payer: Self-pay | Admitting: Women's Health

## 2015-12-17 ENCOUNTER — Ambulatory Visit: Payer: Commercial Managed Care - HMO

## 2015-12-17 VITALS — BP 124/80

## 2015-12-17 DIAGNOSIS — Z23 Encounter for immunization: Secondary | ICD-10-CM

## 2015-12-17 DIAGNOSIS — A749 Chlamydial infection, unspecified: Secondary | ICD-10-CM

## 2015-12-17 NOTE — Progress Notes (Signed)
Patient ID: Vinnie LangtonHannah Taylor, female   DOB: 12/24/1990, 25 y.o.   MRN: [id number] Presents for test of cure chlamydia. Had  treatment failure with Zithromax, completed 7 days of doxycycline. Had negative HIV, hepatitis B, C, RPR. Has been abstinent. X partner treated. Monthly cycle without complaint.  Exam: Appears well. External genitalia within normal limits, speculum exam scant discharge without erythema or odor, GC/Chlamydia culture taken. Bimanual no CMT or tenderness with exam.  Test of cure chlamydia  Plan: GC/Chlamydia culture pending. Reviewed importance of condoms until permanent partner. Second gardasil given today will return to office in 4 months to complete series.

## 2015-12-17 NOTE — Addendum Note (Signed)
Addended by: Harrington ChallengerYOUNG, NANCY J on: 12/17/2015 09:22 AM   Modules accepted: Orders

## 2015-12-17 NOTE — Addendum Note (Signed)
Addended by: Harrington ChallengerYOUNG, Coady Train J on: 12/17/2015 01:40 PM   Modules accepted: Level of Service

## 2015-12-18 ENCOUNTER — Encounter: Payer: Self-pay | Admitting: Women's Health

## 2015-12-18 LAB — GC/CHLAMYDIA PROBE AMP
CT Probe RNA: NOT DETECTED
GC PROBE AMP APTIMA: NOT DETECTED

## 2016-01-07 ENCOUNTER — Ambulatory Visit (INDEPENDENT_AMBULATORY_CARE_PROVIDER_SITE_OTHER): Payer: Commercial Managed Care - HMO | Admitting: Family Medicine

## 2016-01-07 VITALS — BP 118/80 | HR 68 | Temp 98.2°F | Resp 16 | Ht 63.0 in | Wt 139.0 lb

## 2016-01-07 DIAGNOSIS — Z113 Encounter for screening for infections with a predominantly sexual mode of transmission: Secondary | ICD-10-CM | POA: Diagnosis not present

## 2016-01-07 DIAGNOSIS — M79621 Pain in right upper arm: Secondary | ICD-10-CM | POA: Diagnosis not present

## 2016-01-07 DIAGNOSIS — L03115 Cellulitis of right lower limb: Secondary | ICD-10-CM | POA: Diagnosis not present

## 2016-01-07 DIAGNOSIS — Z23 Encounter for immunization: Secondary | ICD-10-CM

## 2016-01-07 NOTE — Patient Instructions (Addendum)
Please let me know if your leg gets worse  For your upper arm- warm compresses, over the counter ibuprofen or acetaminophen  I will send your test results via Mychart in 3-5 days    IF you received an x-ray today, you will [facility]. Please contact [facility] at [phone] with questions or concerns regarding your invoice.   IF you received labwork today, you will receive an invoice from [facility]. Please contact [facility] at [phone] with questions or concerns regarding your invoice.   Our billing staff will not be able to assist you with questions regarding bills from these companies.  You will be contacted with the lab results as soon as they are available. The fastest way to get your results is to activate your My Chart account. Instructions are located on the last page of this paperwork. If you have not heard from us regarding the results in 2 weeks, please contact this office.

## 2016-01-07 NOTE — Progress Notes (Signed)
Subjective:    Patient ID: Shelly LangtonHannah Proudfoot, female    DOB: 02/07/1991, 25 y.o.   MRN: [id number]  HPI This is a 25 yo female who presents today with a rash on her right, upper outer thigh. It started about 5-6 days ago with an area of redness and a white head. She kept it clean and it drained. She applied Neosporyn and marked area and it has gone down. No fever. 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   Has an area of firmness where she got Gardasil 3 weeks ago on right arm.   Requests STD testing today. Had chlamydia earlier this year. Required two treatments. Follow up testing negative. She has had about 15 partners. Uses condoms every time.     Past Medical History  Diagnosis Date  . Chicken pox   . UTI (lower urinary tract infection)     3-4x in 2014  . HPV in female   . H. pylori infection    Past Surgical History  Procedure Laterality Date  . Wisdom tooth extraction  Dec 2011    all four  . Colposcopy      several   Family History  Problem Relation Age of Onset  . Ovarian cancer Maternal Grandmother   . Polycystic kidney disease Mother     and maternal grandmother  . Colon cancer      maternal great grandfather  . Hyperlipidemia Maternal Grandfather   . Heart disease Maternal Grandfather   . Stroke Maternal Grandfather   . Diabetes Maternal Grandfather    Social History  Substance Use Topics  . Smoking status: Never Smoker   . Smokeless tobacco: Never Used  . Alcohol Use: No     Comment: social      Review of Systems No fever or chills. No vaginal discharge, no itching, no burning, no rash.     Objective:   Physical Exam  Constitutional: She is oriented to person, place, and time. She appears well-developed and well-nourished.  HENT:  Head: Normocephalic and atraumatic.  Eyes: Conjunctivae are normal.  Neck: Normal range of motion. Neck supple.  Cardiovascular: Normal rate.     Pulmonary/Chest: Effort normal.  Musculoskeletal: Normal range of motion.  Neurological: She is alert and oriented to person, place, and time.  Skin: Skin is warm and dry.  Right upper outer thigh with approximately 6 cm area mild erythema, small central area of crusting. Smaller than area that was marked yesterday by patient. Much smaller and less erythematous than photograph she has from 48 hours ago.  Right upper arm with approximately 2 cm area firmness, tenderness, no erythema.   Psychiatric: She has a normal mood and affect. Her behavior is normal. Judgment and thought content normal.  Vitals reviewed.     BP 118/80 mmHg  Pulse 68  Temp(Src) 98.2 F (36.8 C) (Oral)  Resp 16  Ht 5\' 3"  (1.6 m)  Wt 139 lb (63.05 kg)  BMI 24.63 kg/m2  SpO2 98%  LMP 01/06/2016 (Exact Date)     Assessment & Plan:  1. Screening for STD (sexually transmitted disease) - HIV antibody - HSV(herpes simplex vrs) 1+2 ab-IgG - GC/Chlamydia Probe Amp - RPR  2. Cellulitis of right lower extremity - this is resolving, advised patient on RTC precautions, she will continue to keep clean and dry, apply antibacterial ointment and has been advised to use warm compresses.  3. Pain in right upper arm -  suspect this is from recent Gardasil injection- warm compresses, OTC analgesics PRN, RTC if no improvement in 2-3 weeks  4. Need for Tdap vaccination - Tdap vaccine greater than or equal to 7yo IM   Olean Ree, FNP-BC  [facility], [facility]  01/07/2016 8:59 AM

## 2016-01-08 LAB — HSV(HERPES SIMPLEX VRS) I + II AB-IGG

## 2016-01-08 LAB — HIV ANTIBODY (ROUTINE TESTING W REFLEX): HIV: NONREACTIVE

## 2016-01-08 LAB — RPR

## 2016-01-08 LAB — GC/CHLAMYDIA PROBE AMP
CT Probe RNA: NOT DETECTED
GC Probe RNA: NOT DETECTED

## 2016-02-01 ENCOUNTER — Other Ambulatory Visit: Payer: Self-pay | Admitting: General Practice

## 2016-02-01 DIAGNOSIS — Z3041 Encounter for surveillance of contraceptive pills: Secondary | ICD-10-CM

## 2016-02-01 MED ORDER — DROSPIRENONE-ETHINYL ESTRADIOL 3-0.03 MG PO TABS: 1.0000 | ORAL_TABLET | Freq: Every day | ORAL | Status: DC

## 2016-02-09 ENCOUNTER — Other Ambulatory Visit: Payer: Self-pay | Admitting: Family Medicine

## 2016-02-11 NOTE — Telephone Encounter (Signed)
Med denied, pt needs to continue OTC vitamin D 2000iu daily.  

## 2016-04-25 ENCOUNTER — Ambulatory Visit (INDEPENDENT_AMBULATORY_CARE_PROVIDER_SITE_OTHER): Payer: Commercial Managed Care - HMO | Admitting: *Deleted

## 2016-04-25 DIAGNOSIS — Z23 Encounter for immunization: Secondary | ICD-10-CM | POA: Diagnosis not present

## 2016-06-10 ENCOUNTER — Ambulatory Visit: Payer: Commercial Managed Care - HMO

## 2016-08-27 ENCOUNTER — Other Ambulatory Visit: Payer: Self-pay | Admitting: Family Medicine

## 2016-10-17 ENCOUNTER — Ambulatory Visit (INDEPENDENT_AMBULATORY_CARE_PROVIDER_SITE_OTHER): Payer: 59 | Admitting: Women's Health

## 2016-10-17 ENCOUNTER — Encounter: Payer: Self-pay | Admitting: Women's Health

## 2016-10-17 VITALS — BP 118/70 | Ht 62.75 in | Wt 143.0 lb

## 2016-10-17 DIAGNOSIS — Z01419 Encounter for gynecological examination (general) (routine) without abnormal findings: Secondary | ICD-10-CM

## 2016-10-17 DIAGNOSIS — Z3041 Encounter for surveillance of contraceptive pills: Secondary | ICD-10-CM

## 2016-10-17 LAB — CBC WITH DIFFERENTIAL/PLATELET
BASOS ABS: 71 {cells}/uL (ref 0–200)
Basophils Relative: 1 %
EOS PCT: 8 %
Eosinophils Absolute: 568 cells/uL — ABNORMAL HIGH (ref 15–500)
HEMATOCRIT: 36.4 % (ref 35.0–45.0)
HEMOGLOBIN: 12.3 g/dL (ref 11.7–15.5)
LYMPHS ABS: 2201 {cells}/uL (ref 850–3900)
LYMPHS PCT: 31 %
MCH: 29.3 pg (ref 27.0–33.0)
MCHC: 33.8 g/dL (ref 32.0–36.0)
MCV: 86.7 fL (ref 80.0–100.0)
MPV: 10.2 fL (ref 7.5–12.5)
Monocytes Absolute: 426 cells/uL (ref 200–950)
Monocytes Relative: 6 %
NEUTROS PCT: 54 %
Neutro Abs: 3834 cells/uL (ref 1500–7800)
Platelets: 352 10*3/uL (ref 140–400)
RBC: 4.2 MIL/uL (ref 3.80–5.10)
RDW: 13.3 % (ref 11.0–15.0)
WBC: 7.1 10*3/uL (ref 3.8–10.8)

## 2016-10-17 MED ORDER — DROSPIRENONE-ETHINYL ESTRADIOL 3-0.03 MG PO TABS: 1.0000 | ORAL_TABLET | Freq: Every day | ORAL | 4 refills | Status: DC

## 2016-10-17 NOTE — Progress Notes (Signed)
Shelly LangtonHannah Taylor 06/09/1991 [id number]    History:    Presents for annual exam. Monthly cycle on OCs without complaint. Normal Pap history. Same partner with negative STD screen. Gardasil series completed.  History of questionable polycystic kidney disease has seen a urologist in the past. History of low vitamin D has continued on vitamin D supplement.  Past medical history, past surgical history, family history and social history were all reviewed and documented in the EPIC chart. Works at [employer] doing well. Mother polycystic kidney disease.  ROS:  A ROS was performed and pertinent positives and negatives are included.  Exam:  Vitals:   10/17/16 1209  BP: 118/70  Weight: 143 lb (64.9 kg)  Height: 5' 2.75" (1.594 m)   Body mass index is 25.53 kg/m.   General appearance:  Normal Thyroid:  Symmetrical, normal in size, without palpable masses or nodularity. Respiratory  Auscultation:  Clear without wheezing or rhonchi Cardiovascular  Auscultation:  Regular rate, without rubs, murmurs or gallops  Edema/varicosities:  Not grossly evident Abdominal  Soft,nontender, without masses, guarding or rebound.  Liver/spleen:  No organomegaly noted  Hernia:  None appreciated  Skin  Inspection:  Grossly normal   Breasts: Examined lying and sitting.     Right: Without masses, retractions, discharge or axillary adenopathy.     Left: Without masses, retractions, discharge or axillary adenopathy. Gentitourinary   Inguinal/mons:  Normal without inguinal adenopathy  External genitalia:  Normal  BUS/Urethra/Skene's glands:  Normal  Vagina:  Normal  Cervix:  Normal  Uterus:   normal in size, shape and contour.  Midline and mobile  Adnexa/parametria:     Rt: Without masses or tenderness.   Lt: Without masses or tenderness.  Anus and perineum: Normal    Assessment/Plan:  26 y.o. S WF G0  for annual exam with no complaints.  Monthly cycle on [name]  Plan: [name] prescription,  proper use, slight risk for blood clots and strokes reviewed. SBE's, continue regular exercise, calcium rich diet, MVI daily encouraged. CBC, UA.. Pap normal 2017, new screening guidelines reviewed.  Shelly ChallengerYOUNG,Shelly Taylor [facility], 12:51 PM 10/17/2016

## 2016-10-17 NOTE — Patient Instructions (Signed)
Health Maintenance, Female Adopting a healthy lifestyle and getting preventive care can go a long way to promote health and wellness. Talk with your health care provider about what schedule of regular examinations is right for you. This is a good chance for you to check in with your provider about disease prevention and staying healthy. In between checkups, there are plenty of things you can do on your own. Experts have done a lot of research about which lifestyle changes and preventive measures are most likely to keep you healthy. Ask your health care provider for more information. Weight and diet Eat a healthy diet  Be sure to include plenty of vegetables, fruits, low-fat dairy products, and lean protein.  Do not eat a lot of foods high in solid fats, added sugars, or salt.  Get regular exercise. This is one of the most important things you can do for your health.  Most adults should exercise for at least 150 minutes each week. The exercise should increase your heart rate and make you sweat (moderate-intensity exercise).  Most adults should also do strengthening exercises at least twice a week. This is in addition to the moderate-intensity exercise. Maintain a healthy weight  Body mass index (BMI) is a measurement that can be used to identify possible weight problems. It estimates body fat based on height and weight. Your health care provider can help determine your BMI and help you achieve or maintain a healthy weight.  For females 76 years of age and older:  A BMI below 18.5 is considered underweight.  A BMI of 18.5 to 24.9 is normal.  A BMI of 25 to 29.9 is considered overweight.  A BMI of 30 and above is considered obese. Watch levels of cholesterol and blood lipids  You should start having your blood tested for lipids and cholesterol at 25 years of age, then have this test every 5 years.  You may need to have your cholesterol levels checked more often if:  Your lipid or  cholesterol levels are high.  You are older than 25 years of age.  You are at high risk for heart disease. Cancer screening Lung Cancer  Lung cancer screening is recommended for adults 64-42 years old who are at high risk for lung cancer because of a history of smoking.  A yearly low-dose CT scan of the lungs is recommended for people who:  Currently smoke.  Have quit within the past 15 years.  Have at least a 30-pack-year history of smoking. A pack year is smoking an average of one pack of cigarettes a day for 1 year.  Yearly screening should continue until it has been 15 years since you quit.  Yearly screening should stop if you develop a health problem that would prevent you from having lung cancer treatment. Breast Cancer  Practice breast self-awareness. This means understanding how your breasts normally appear and feel.  It also means doing regular breast self-exams. Let your health care provider know about any changes, no matter how small.  If you are in your 20s or 30s, you should have a clinical breast exam (CBE) by a health care provider every 1-3 years as part of a regular health exam.  If you are 34 or older, have a CBE every year. Also consider having a breast X-ray (mammogram) every year.  If you have a family history of breast cancer, talk to your health care provider about genetic screening.  If you are at high risk for breast cancer, talk  to your health care provider about having an MRI and a mammogram every year.  Breast cancer gene (BRCA) assessment is recommended for women who have family members with BRCA-related cancers. BRCA-related cancers include:  Breast.  Ovarian.  Tubal.  Peritoneal cancers.  Results of the assessment will determine the need for genetic counseling and BRCA1 and BRCA2 testing. Cervical Cancer  Your health care provider may recommend that you be screened regularly for cancer of the pelvic organs (ovaries, uterus, and vagina).  This screening involves a pelvic examination, including checking for microscopic changes to the surface of your cervix (Pap test). You may be encouraged to have this screening done every 3 years, beginning at age 24.  For women ages 66-65, health care providers may recommend pelvic exams and Pap testing every 3 years, or they may recommend the Pap and pelvic exam, combined with testing for human papilloma virus (HPV), every 5 years. Some types of HPV increase your risk of cervical cancer. Testing for HPV may also be done on women of any age with unclear Pap test results.  Other health care providers may not recommend any screening for nonpregnant women who are considered low risk for pelvic cancer and who do not have symptoms. Ask your health care provider if a screening pelvic exam is right for you.  If you have had past treatment for cervical cancer or a condition that could lead to cancer, you need Pap tests and screening for cancer for at least 20 years after your treatment. If Pap tests have been discontinued, your risk factors (such as having a new sexual partner) need to be reassessed to determine if screening should resume. Some women have medical problems that increase the chance of getting cervical cancer. In these cases, your health care provider may recommend more frequent screening and Pap tests. Colorectal Cancer  This type of cancer can be detected and often prevented.  Routine colorectal cancer screening usually begins at 25 years of age and continues through 25 years of age.  Your health care provider may recommend screening at an earlier age if you have risk factors for colon cancer.  Your health care provider may also recommend using home test kits to check for hidden blood in the stool.  A small camera at the end of a tube can be used to examine your colon directly (sigmoidoscopy or colonoscopy). This is done to check for the earliest forms of colorectal cancer.  Routine  screening usually begins at age 41.  Direct examination of the colon should be repeated every 5-10 years through 25 years of age. However, you may need to be screened more often if early forms of precancerous polyps or small growths are found. Skin Cancer  Check your skin from head to toe regularly.  Tell your health care provider about any new moles or changes in moles, especially if there is a change in a mole's shape or color.  Also tell your health care provider if you have a mole that is larger than the size of a pencil eraser.  Always use sunscreen. Apply sunscreen liberally and repeatedly throughout the day.  Protect yourself by wearing long sleeves, pants, a wide-brimmed hat, and sunglasses whenever you are outside. Heart disease, diabetes, and high blood pressure  High blood pressure causes heart disease and increases the risk of stroke. High blood pressure is more likely to develop in:  People who have blood pressure in the high end of the normal range (130-139/85-89 mm Hg).  People who are overweight or obese.  People who are African American.  If you are 59-24 years of age, have your blood pressure checked every 3-5 years. If you are 34 years of age or older, have your blood pressure checked every year. You should have your blood pressure measured twice-once when you are at a hospital or clinic, and once when you are not at a hospital or clinic. Record the average of the two measurements. To check your blood pressure when you are not at a hospital or clinic, you can use:  An automated blood pressure machine at a pharmacy.  A home blood pressure monitor.  If you are between 29 years and 60 years old, ask your health care provider if you should take aspirin to prevent strokes.  Have regular diabetes screenings. This involves taking a blood sample to check your fasting blood sugar level.  If you are at a normal weight and have a low risk for diabetes, have this test once  every three years after 25 years of age.  If you are overweight and have a high risk for diabetes, consider being tested at a younger age or more often. Preventing infection Hepatitis B  If you have a higher risk for hepatitis B, you should be screened for this virus. You are considered at high risk for hepatitis B if:  You were born in a country where hepatitis B is common. Ask your health care provider which countries are considered high risk.  Your parents were born in a high-risk country, and you have not been immunized against hepatitis B (hepatitis B vaccine).  You have HIV or AIDS.  You use needles to inject street drugs.  You live with someone who has hepatitis B.  You have had sex with someone who has hepatitis B.  You get hemodialysis treatment.  You take certain medicines for conditions, including cancer, organ transplantation, and autoimmune conditions. Hepatitis C  Blood testing is recommended for:  Everyone born from [birth year] through 1964.  Anyone with known risk factors for hepatitis C. Sexually transmitted infections (STIs)  You should be screened for sexually transmitted infections (STIs) including gonorrhea and chlamydia if:  You are sexually active and are younger than 25 years of age.  You are older than 25 years of age and your health care provider tells you that you are at risk for this type of infection.  Your sexual activity has changed since you were last screened and you are at an increased risk for chlamydia or gonorrhea. Ask your health care provider if you are at risk.  If you do not have HIV, but are at risk, it may be recommended that you take a prescription medicine daily to prevent HIV infection. This is called pre-exposure prophylaxis (PrEP). You are considered at risk if:  You are sexually active and do not regularly use condoms or know the HIV status of your partner(s).  You take drugs by injection.  You are sexually active with a partner  who has HIV. Talk with your health care provider about whether you are at high risk of being infected with HIV. If you choose to begin PrEP, you should first be tested for HIV. You should then be tested every 3 months for as long as you are taking PrEP. Pregnancy  If you are premenopausal and you may become pregnant, ask your health care provider about preconception counseling.  If you may become pregnant, take 400 to 800 micrograms (mcg) of folic acid  every day.  If you want to prevent pregnancy, talk to your health care provider about birth control (contraception). Osteoporosis and menopause  Osteoporosis is a disease in which the bones lose minerals and strength with aging. This can result in serious bone fractures. Your risk for osteoporosis can be identified using a bone density scan.  If you are 4 years of age or older, or if you are at risk for osteoporosis and fractures, ask your health care provider if you should be screened.  Ask your health care provider whether you should take a calcium or vitamin D supplement to lower your risk for osteoporosis.  Menopause may have certain physical symptoms and risks.  Hormone replacement therapy may reduce some of these symptoms and risks. Talk to your health care provider about whether hormone replacement therapy is right for you. Follow these instructions at home:  Schedule regular health, dental, and eye exams.  Stay current with your immunizations.  Do not use any tobacco products including cigarettes, chewing tobacco, or electronic cigarettes.  If you are pregnant, do not drink alcohol.  If you are breastfeeding, limit how much and how often you drink alcohol.  Limit alcohol intake to no more than 1 drink per day for nonpregnant women. One drink equals 12 ounces of beer, 5 ounces of wine, or 1 ounces of hard liquor.  Do not use street drugs.  Do not share needles.  Ask your health care provider for help if you need support  or information about quitting drugs.  Tell your health care provider if you often feel depressed.  Tell your health care provider if you have ever been abused or do not feel safe at home. This information is not intended to replace advice given to you by your health care provider. Make sure you discuss any questions you have with your health care provider. Document Released: 02/17/2011 Document Revised: 01/10/2016 Document Reviewed: 05/08/2015 Elsevier Interactive Patient Education  2017 [employer].

## 2016-10-18 LAB — URINALYSIS W MICROSCOPIC + REFLEX CULTURE
BACTERIA UA: NONE SEEN [HPF]
BILIRUBIN URINE: NEGATIVE
CRYSTALS: NONE SEEN [HPF]
Casts: NONE SEEN [LPF]
GLUCOSE, UA: NEGATIVE
Hgb urine dipstick: NEGATIVE
KETONES UR: NEGATIVE
LEUKOCYTES UA: NEGATIVE
Nitrite: NEGATIVE
Protein, ur: NEGATIVE
SPECIFIC GRAVITY, URINE: 1.015 (ref 1.001–1.035)
Squamous Epithelial / LPF: NONE SEEN [HPF] (ref <=5)
WBC UA: NONE SEEN WBC/HPF (ref <=5)
Yeast: NONE SEEN [HPF]
pH: 6 (ref 5.0–8.0)

## 2016-10-19 LAB — URINE CULTURE: ORGANISM ID, BACTERIA: NO GROWTH

## 2016-12-26 ENCOUNTER — Ambulatory Visit (INDEPENDENT_AMBULATORY_CARE_PROVIDER_SITE_OTHER): Payer: 59 | Admitting: Family Medicine

## 2016-12-26 ENCOUNTER — Encounter: Payer: Self-pay | Admitting: Family Medicine

## 2016-12-26 VITALS — BP 116/76 | HR 78 | Temp 98.3°F | Resp 16 | Ht 63.0 in | Wt 135.5 lb

## 2016-12-26 DIAGNOSIS — Z0000 Encounter for general adult medical examination without abnormal findings: Secondary | ICD-10-CM | POA: Diagnosis not present

## 2016-12-26 LAB — CBC WITH DIFFERENTIAL/PLATELET
BASOS ABS: 0.2 10*3/uL — AB (ref 0.0–0.1)
Basophils Relative: 2.1 % (ref 0.0–3.0)
Eosinophils Absolute: 0.5 10*3/uL (ref 0.0–0.7)
Eosinophils Relative: 7.3 % — ABNORMAL HIGH (ref 0.0–5.0)
HEMATOCRIT: 37.8 % (ref 36.0–46.0)
HEMOGLOBIN: 13.1 g/dL (ref 12.0–15.0)
LYMPHS PCT: 23.5 % (ref 12.0–46.0)
Lymphs Abs: 1.7 10*3/uL (ref 0.7–4.0)
MCHC: 34.7 g/dL (ref 30.0–36.0)
MCV: 87.2 fl (ref 78.0–100.0)
MONOS PCT: 6.7 % (ref 3.0–12.0)
Monocytes Absolute: 0.5 10*3/uL (ref 0.1–1.0)
Neutro Abs: 4.5 10*3/uL (ref 1.4–7.7)
Neutrophils Relative %: 60.4 % (ref 43.0–77.0)
Platelets: 289 10*3/uL (ref 150.0–400.0)
RBC: 4.34 Mil/uL (ref 3.87–5.11)
RDW: 12.8 % (ref 11.5–15.5)
WBC: 7.4 10*3/uL (ref 4.0–10.5)

## 2016-12-26 LAB — HEPATIC FUNCTION PANEL
ALBUMIN: 4.1 g/dL (ref 3.5–5.2)
ALK PHOS: 42 U/L (ref 39–117)
ALT: 14 U/L (ref 0–35)
AST: 16 U/L (ref 0–37)
BILIRUBIN DIRECT: 0.1 mg/dL (ref 0.0–0.3)
Total Bilirubin: 0.4 mg/dL (ref 0.2–1.2)
Total Protein: 6.5 g/dL (ref 6.0–8.3)

## 2016-12-26 LAB — BASIC METABOLIC PANEL
BUN: 20 mg/dL (ref 6–23)
CALCIUM: 9.1 mg/dL (ref 8.4–10.5)
CO2: 27 mEq/L (ref 19–32)
Chloride: 105 mEq/L (ref 96–112)
Creatinine, Ser: 0.65 mg/dL (ref 0.40–1.20)
GFR: 118.1 mL/min (ref >60.00)
Glucose, Bld: 84 mg/dL (ref 70–99)
Potassium: 3.8 mEq/L (ref 3.5–5.1)
SODIUM: 138 meq/L (ref 135–145)

## 2016-12-26 LAB — LIPID PANEL
CHOL/HDL RATIO: 3
Cholesterol: 141 mg/dL (ref 0–200)
HDL: 53.3 mg/dL (ref >39.00)
LDL Cholesterol: 68 mg/dL (ref 0–99)
NonHDL: 88.03
Triglycerides: 98 mg/dL (ref 0.0–149.0)
VLDL: 19.6 mg/dL (ref 0.0–40.0)

## 2016-12-26 LAB — TSH: TSH: 1.36 u[IU]/mL (ref 0.35–4.50)

## 2016-12-26 LAB — VITAMIN D 25 HYDROXY (VIT D DEFICIENCY, FRACTURES): VITD: 43.93 ng/mL (ref 30.00–100.00)

## 2016-12-26 NOTE — Progress Notes (Signed)
   Subjective:    Patient ID: Shelly LangtonHannah Taylor, female    DOB: 10/12/1990, 26 y.o.   MRN: [id number]  HPI CPE- UTD on GYN ([facility]).  UTD on HPV and Tdap vaccines.  Is down 8 lbs after starting with a Systems analystpersonal trainer.     Review of Systems Patient reports no vision/ hearing changes, adenopathy,fever, weight change,  persistant/recurrent hoarseness , swallowing issues, chest pain, palpitations, edema, persistant/recurrent cough, hemoptysis, dyspnea (rest/exertional/paroxysmal nocturnal), gastrointestinal bleeding (melena, rectal bleeding), abdominal pain, significant heartburn, bowel changes, GU symptoms (dysuria, hematuria, incontinence), Gyn symptoms (abnormal  bleeding, pain),  syncope, focal weakness, memory loss, numbness & tingling, skin/hair/nail changes, abnormal bruising or bleeding, anxiety, or depression.     Objective:   Physical Exam General Appearance:    Alert, cooperative, no distress, appears stated age  Head:    Normocephalic, without obvious abnormality, atraumatic  Eyes:    PERRL, conjunctiva/corneas clear, EOM's intact, fundi    benign, both eyes  Ears:    Normal TM's and external ear canals, both ears  Nose:   Nares normal, septum midline, mucosa normal, no drainage    or sinus tenderness  Throat:   Lips, mucosa, and tongue normal; teeth and gums normal  Neck:   Supple, symmetrical, trachea midline, no adenopathy;    Thyroid: no enlargement/tenderness/nodules  Back:     Symmetric, no curvature, ROM normal, no CVA tenderness  Lungs:     Clear to auscultation bilaterally, respirations unlabored  Chest Wall:    No tenderness or deformity   Heart:    Regular rate and rhythm, S1 and S2 normal, no murmur, rub   or gallop  Breast Exam:    Deferred to GYN  Abdomen:     Soft, non-tender, bowel sounds active all four quadrants,    no masses, no organomegaly  Genitalia:    Deferred to GYN  Rectal:    Extremities:   Extremities normal, atraumatic, no cyanosis or edema    Pulses:   2+ and symmetric all extremities  Skin:   Skin color, texture, turgor normal, no rashes or lesions  Lymph nodes:   Cervical, supraclavicular, and axillary nodes normal  Neurologic:   CNII-XII intact, normal strength, sensation and reflexes    throughout          Assessment & Plan:

## 2016-12-26 NOTE — Patient Instructions (Signed)
Follow up in 1 year or as needed We'll notify you of your lab results and make any changes if needed Continue to work on healthy diet and regular exercise- you look great! Call with any questions or concerns Happy Early Birthday!!! 

## 2016-12-26 NOTE — Progress Notes (Signed)
Pre visit review using our clinic review tool, if applicable. No additional management support is needed unless otherwise documented below in the visit note. 

## 2016-12-26 NOTE — Assessment & Plan Note (Signed)
Pt's PE WNL.  UTD on immunizations and GYN.  Check labs.  Anticipatory guidance provided.  

## 2016-12-31 ENCOUNTER — Encounter: Payer: Self-pay | Admitting: Gynecology

## 2017-01-21 ENCOUNTER — Telehealth: Payer: Self-pay | Admitting: Family Medicine

## 2017-01-21 MED ORDER — DROSPIRENONE-ETHINYL ESTRADIOL 3-0.03 MG PO TABS: 1.0000 | ORAL_TABLET | Freq: Every day | ORAL | 1 refills | Status: AC

## 2017-01-21 NOTE — Telephone Encounter (Signed)
Medication filled to pharmacy as requested.   

## 2017-01-21 NOTE — Telephone Encounter (Signed)
Ok for new script as these are equivalent

## 2017-01-21 NOTE — Telephone Encounter (Signed)
Patient states the script pcp wrote for Janae BridgemanOcella was denied by insurance company.  They are requesting a new 90 day script for Syeda, which is the preferred medication.  [facility]:  [facility] [place] - [phone] [phone] ([phone] (Fax)

## 2017-10-19 ENCOUNTER — Encounter: Payer: 59 | Admitting: Women's Health
